# Patient Record
Sex: Female | Born: 1965 | Race: Black or African American | Hispanic: No | Marital: Single | Smoking: Former smoker
Health system: Southern US, Community
[De-identification: ages and names within clinical notes are randomized; demographics above are authoritative.]

## PROBLEM LIST (undated history)

## (undated) DIAGNOSIS — E079 Disorder of thyroid, unspecified: Secondary | ICD-10-CM

## (undated) DIAGNOSIS — I10 Essential (primary) hypertension: Secondary | ICD-10-CM

## (undated) DIAGNOSIS — K219 Gastro-esophageal reflux disease without esophagitis: Secondary | ICD-10-CM

## (undated) DIAGNOSIS — N83209 Unspecified ovarian cyst, unspecified side: Secondary | ICD-10-CM

## (undated) DIAGNOSIS — D75A Glucose-6-phosphate dehydrogenase (G6PD) deficiency without anemia: Secondary | ICD-10-CM

## (undated) DIAGNOSIS — K589 Irritable bowel syndrome without diarrhea: Secondary | ICD-10-CM

## (undated) DIAGNOSIS — J302 Other seasonal allergic rhinitis: Secondary | ICD-10-CM

## (undated) DIAGNOSIS — G510 Bell's palsy: Secondary | ICD-10-CM

## (undated) HISTORY — DX: Gastro-esophageal reflux disease without esophagitis: K21.9

## (undated) HISTORY — PX: OVARIAN CYST REMOVAL: SHX89

## (undated) HISTORY — PX: TUBAL LIGATION: SHX77

## (undated) HISTORY — PX: TONSILLECTOMY: SUR1361

## (undated) HISTORY — PX: APPENDECTOMY: SHX54

## (undated) HISTORY — PX: FOOT SURGERY: SHX648

---

## 1997-05-20 ENCOUNTER — Emergency Department (HOSPITAL_COMMUNITY): Admission: EM | Admit: 1997-05-20 | Discharge: 1997-05-20 | Payer: Self-pay | Admitting: Emergency Medicine

## 1997-08-15 ENCOUNTER — Emergency Department (HOSPITAL_COMMUNITY): Admission: EM | Admit: 1997-08-15 | Discharge: 1997-08-15 | Payer: Self-pay

## 1997-08-18 ENCOUNTER — Emergency Department (HOSPITAL_COMMUNITY): Admission: EM | Admit: 1997-08-18 | Discharge: 1997-08-18 | Payer: Self-pay | Admitting: Internal Medicine

## 1998-02-27 ENCOUNTER — Inpatient Hospital Stay (HOSPITAL_COMMUNITY): Admission: AD | Admit: 1998-02-27 | Discharge: 1998-02-27 | Payer: Self-pay | Admitting: Obstetrics & Gynecology

## 1998-05-25 ENCOUNTER — Emergency Department (HOSPITAL_COMMUNITY): Admission: EM | Admit: 1998-05-25 | Discharge: 1998-05-25 | Payer: Self-pay | Admitting: Emergency Medicine

## 1998-05-25 ENCOUNTER — Encounter: Payer: Self-pay | Admitting: Emergency Medicine

## 1998-06-12 ENCOUNTER — Emergency Department (HOSPITAL_COMMUNITY): Admission: EM | Admit: 1998-06-12 | Discharge: 1998-06-12 | Payer: Self-pay | Admitting: Emergency Medicine

## 1998-06-12 ENCOUNTER — Emergency Department (HOSPITAL_COMMUNITY): Admission: EM | Admit: 1998-06-12 | Discharge: 1998-06-12 | Payer: Self-pay

## 1998-06-14 ENCOUNTER — Encounter: Payer: Self-pay | Admitting: Obstetrics & Gynecology

## 1998-06-14 ENCOUNTER — Inpatient Hospital Stay (HOSPITAL_COMMUNITY): Admission: AD | Admit: 1998-06-14 | Discharge: 1998-06-14 | Payer: Self-pay | Admitting: Obstetrics & Gynecology

## 1998-06-17 ENCOUNTER — Inpatient Hospital Stay (HOSPITAL_COMMUNITY): Admission: AD | Admit: 1998-06-17 | Discharge: 1998-06-17 | Payer: Self-pay | Admitting: Obstetrics

## 1998-06-25 ENCOUNTER — Observation Stay (HOSPITAL_COMMUNITY): Admission: RE | Admit: 1998-06-25 | Discharge: 1998-06-26 | Payer: Self-pay | Admitting: Gynecology

## 1998-07-16 ENCOUNTER — Emergency Department (HOSPITAL_COMMUNITY): Admission: EM | Admit: 1998-07-16 | Discharge: 1998-07-16 | Payer: Self-pay | Admitting: Emergency Medicine

## 1998-07-27 ENCOUNTER — Emergency Department (HOSPITAL_COMMUNITY): Admission: EM | Admit: 1998-07-27 | Discharge: 1998-07-27 | Payer: Self-pay | Admitting: Emergency Medicine

## 1998-12-08 ENCOUNTER — Emergency Department (HOSPITAL_COMMUNITY): Admission: EM | Admit: 1998-12-08 | Discharge: 1998-12-08 | Payer: Self-pay | Admitting: Emergency Medicine

## 1998-12-08 ENCOUNTER — Encounter: Payer: Self-pay | Admitting: Emergency Medicine

## 1999-01-22 ENCOUNTER — Emergency Department (HOSPITAL_COMMUNITY): Admission: EM | Admit: 1999-01-22 | Discharge: 1999-01-22 | Payer: Self-pay | Admitting: *Deleted

## 1999-02-01 ENCOUNTER — Emergency Department (HOSPITAL_COMMUNITY): Admission: EM | Admit: 1999-02-01 | Discharge: 1999-02-01 | Payer: Self-pay | Admitting: Emergency Medicine

## 1999-02-06 ENCOUNTER — Emergency Department (HOSPITAL_COMMUNITY): Admission: EM | Admit: 1999-02-06 | Discharge: 1999-02-06 | Payer: Self-pay | Admitting: Emergency Medicine

## 1999-03-10 ENCOUNTER — Emergency Department (HOSPITAL_COMMUNITY): Admission: EM | Admit: 1999-03-10 | Discharge: 1999-03-10 | Payer: Self-pay | Admitting: Emergency Medicine

## 1999-03-10 ENCOUNTER — Encounter: Payer: Self-pay | Admitting: Emergency Medicine

## 1999-03-12 ENCOUNTER — Encounter: Payer: Self-pay | Admitting: Emergency Medicine

## 1999-03-12 ENCOUNTER — Ambulatory Visit (HOSPITAL_COMMUNITY): Admission: RE | Admit: 1999-03-12 | Discharge: 1999-03-12 | Payer: Self-pay | Admitting: Emergency Medicine

## 1999-04-14 ENCOUNTER — Emergency Department (HOSPITAL_COMMUNITY): Admission: EM | Admit: 1999-04-14 | Discharge: 1999-04-14 | Payer: Self-pay | Admitting: Emergency Medicine

## 1999-07-07 ENCOUNTER — Emergency Department (HOSPITAL_COMMUNITY): Admission: EM | Admit: 1999-07-07 | Discharge: 1999-07-07 | Payer: Self-pay | Admitting: Emergency Medicine

## 1999-07-20 ENCOUNTER — Emergency Department (HOSPITAL_COMMUNITY): Admission: EM | Admit: 1999-07-20 | Discharge: 1999-07-20 | Payer: Self-pay | Admitting: Internal Medicine

## 1999-10-05 ENCOUNTER — Emergency Department (HOSPITAL_COMMUNITY): Admission: EM | Admit: 1999-10-05 | Discharge: 1999-10-05 | Payer: Self-pay | Admitting: Emergency Medicine

## 1999-10-05 ENCOUNTER — Encounter: Payer: Self-pay | Admitting: Emergency Medicine

## 1999-10-13 ENCOUNTER — Emergency Department (HOSPITAL_COMMUNITY): Admission: EM | Admit: 1999-10-13 | Discharge: 1999-10-13 | Payer: Self-pay | Admitting: Emergency Medicine

## 2000-02-01 ENCOUNTER — Inpatient Hospital Stay (HOSPITAL_COMMUNITY): Admission: AD | Admit: 2000-02-01 | Discharge: 2000-02-01 | Payer: Self-pay | Admitting: Obstetrics & Gynecology

## 2000-02-10 ENCOUNTER — Emergency Department (HOSPITAL_COMMUNITY): Admission: EM | Admit: 2000-02-10 | Discharge: 2000-02-10 | Payer: Self-pay | Admitting: Emergency Medicine

## 2000-02-10 ENCOUNTER — Encounter: Payer: Self-pay | Admitting: Emergency Medicine

## 2000-03-20 ENCOUNTER — Emergency Department (HOSPITAL_COMMUNITY): Admission: EM | Admit: 2000-03-20 | Discharge: 2000-03-20 | Payer: Self-pay | Admitting: Emergency Medicine

## 2000-11-08 ENCOUNTER — Emergency Department (HOSPITAL_COMMUNITY): Admission: EM | Admit: 2000-11-08 | Discharge: 2000-11-08 | Payer: Self-pay | Admitting: Emergency Medicine

## 2001-12-21 ENCOUNTER — Emergency Department (HOSPITAL_COMMUNITY): Admission: EM | Admit: 2001-12-21 | Discharge: 2001-12-21 | Payer: Self-pay | Admitting: Emergency Medicine

## 2001-12-24 ENCOUNTER — Emergency Department (HOSPITAL_COMMUNITY): Admission: EM | Admit: 2001-12-24 | Discharge: 2001-12-24 | Payer: Self-pay | Admitting: Emergency Medicine

## 2002-02-19 ENCOUNTER — Emergency Department (HOSPITAL_COMMUNITY): Admission: EM | Admit: 2002-02-19 | Discharge: 2002-02-19 | Payer: Self-pay | Admitting: Emergency Medicine

## 2002-02-19 ENCOUNTER — Encounter: Payer: Self-pay | Admitting: Emergency Medicine

## 2002-10-28 ENCOUNTER — Emergency Department (HOSPITAL_COMMUNITY): Admission: EM | Admit: 2002-10-28 | Discharge: 2002-10-28 | Payer: Self-pay

## 2003-06-17 ENCOUNTER — Emergency Department (HOSPITAL_COMMUNITY): Admission: EM | Admit: 2003-06-17 | Discharge: 2003-06-17 | Payer: Self-pay | Admitting: Emergency Medicine

## 2004-01-27 ENCOUNTER — Emergency Department (HOSPITAL_COMMUNITY): Admission: EM | Admit: 2004-01-27 | Discharge: 2004-01-27 | Payer: Self-pay | Admitting: Emergency Medicine

## 2004-06-22 ENCOUNTER — Emergency Department (HOSPITAL_COMMUNITY): Admission: EM | Admit: 2004-06-22 | Discharge: 2004-06-22 | Payer: Self-pay | Admitting: Emergency Medicine

## 2005-04-17 ENCOUNTER — Emergency Department (HOSPITAL_COMMUNITY): Admission: EM | Admit: 2005-04-17 | Discharge: 2005-04-17 | Payer: Self-pay | Admitting: Emergency Medicine

## 2005-05-02 ENCOUNTER — Emergency Department (HOSPITAL_COMMUNITY): Admission: EM | Admit: 2005-05-02 | Discharge: 2005-05-02 | Payer: Self-pay | Admitting: Emergency Medicine

## 2005-05-10 ENCOUNTER — Emergency Department (HOSPITAL_COMMUNITY): Admission: EM | Admit: 2005-05-10 | Discharge: 2005-05-11 | Payer: Self-pay | Admitting: Emergency Medicine

## 2005-07-27 ENCOUNTER — Emergency Department (HOSPITAL_COMMUNITY): Admission: EM | Admit: 2005-07-27 | Discharge: 2005-07-27 | Payer: Self-pay | Admitting: Emergency Medicine

## 2006-09-13 ENCOUNTER — Inpatient Hospital Stay (HOSPITAL_COMMUNITY): Admission: AD | Admit: 2006-09-13 | Discharge: 2006-09-13 | Payer: Self-pay | Admitting: Gynecology

## 2008-02-14 ENCOUNTER — Emergency Department (HOSPITAL_COMMUNITY): Admission: EM | Admit: 2008-02-14 | Discharge: 2008-02-14 | Payer: Self-pay | Admitting: Emergency Medicine

## 2008-02-23 ENCOUNTER — Emergency Department (HOSPITAL_COMMUNITY): Admission: EM | Admit: 2008-02-23 | Discharge: 2008-02-23 | Payer: Self-pay | Admitting: Emergency Medicine

## 2009-10-06 ENCOUNTER — Emergency Department (HOSPITAL_COMMUNITY): Admission: EM | Admit: 2009-10-06 | Discharge: 2009-10-06 | Payer: Self-pay | Admitting: Emergency Medicine

## 2009-12-04 ENCOUNTER — Emergency Department (HOSPITAL_COMMUNITY): Admission: EM | Admit: 2009-12-04 | Discharge: 2009-12-04 | Payer: Self-pay | Admitting: Emergency Medicine

## 2010-04-28 LAB — URINALYSIS, ROUTINE W REFLEX MICROSCOPIC
Glucose, UA: NEGATIVE mg/dL
Ketones, ur: NEGATIVE mg/dL
Nitrite: NEGATIVE
Specific Gravity, Urine: 1.014 (ref 1.005–1.030)
pH: 6.5 (ref 5.0–8.0)

## 2010-04-28 LAB — POCT I-STAT, CHEM 8
BUN: 11 mg/dL (ref 6–23)
HCT: 39 % (ref 36.0–46.0)
Potassium: 4.4 mEq/L (ref 3.5–5.1)

## 2010-04-28 LAB — WET PREP, GENITAL: Yeast Wet Prep HPF POC: NONE SEEN

## 2010-04-28 LAB — GC/CHLAMYDIA PROBE AMP, GENITAL: Chlamydia, DNA Probe: NEGATIVE

## 2010-04-28 LAB — URINE MICROSCOPIC-ADD ON

## 2010-04-28 LAB — POCT PREGNANCY, URINE: Preg Test, Ur: NEGATIVE

## 2010-05-14 ENCOUNTER — Emergency Department (HOSPITAL_COMMUNITY)
Admission: EM | Admit: 2010-05-14 | Discharge: 2010-05-14 | Disposition: A | Discharge status: Home / Self Care | Payer: Self-pay | Attending: Emergency Medicine | Admitting: Emergency Medicine

## 2010-05-14 DIAGNOSIS — R059 Cough, unspecified: Secondary | ICD-10-CM | POA: Insufficient documentation

## 2010-05-14 DIAGNOSIS — R197 Diarrhea, unspecified: Secondary | ICD-10-CM | POA: Insufficient documentation

## 2010-05-14 DIAGNOSIS — R112 Nausea with vomiting, unspecified: Secondary | ICD-10-CM | POA: Insufficient documentation

## 2010-05-14 DIAGNOSIS — R51 Headache: Secondary | ICD-10-CM | POA: Insufficient documentation

## 2010-05-14 DIAGNOSIS — R05 Cough: Secondary | ICD-10-CM | POA: Insufficient documentation

## 2010-05-14 DIAGNOSIS — J069 Acute upper respiratory infection, unspecified: Secondary | ICD-10-CM | POA: Insufficient documentation

## 2010-11-29 LAB — URINALYSIS, ROUTINE W REFLEX MICROSCOPIC
Glucose, UA: NEGATIVE
Leukocytes, UA: NEGATIVE
Protein, ur: NEGATIVE
pH: 6

## 2010-11-29 LAB — URINE MICROSCOPIC-ADD ON

## 2010-11-29 LAB — CBC
HCT: 34.7 — ABNORMAL LOW
Platelets: 279

## 2010-11-29 LAB — GC/CHLAMYDIA PROBE AMP, GENITAL: GC Probe Amp, Genital: NEGATIVE

## 2010-11-29 LAB — WET PREP, GENITAL
Trich, Wet Prep: NONE SEEN
Yeast Wet Prep HPF POC: NONE SEEN

## 2010-11-29 LAB — POCT PREGNANCY, URINE: Preg Test, Ur: NEGATIVE

## 2011-04-17 ENCOUNTER — Inpatient Hospital Stay (HOSPITAL_COMMUNITY): Payer: Self-pay

## 2011-04-17 ENCOUNTER — Encounter (HOSPITAL_COMMUNITY): Payer: Self-pay | Admitting: *Deleted

## 2011-04-17 ENCOUNTER — Inpatient Hospital Stay (HOSPITAL_COMMUNITY)
Admission: AD | Admit: 2011-04-17 | Discharge: 2011-04-17 | Disposition: A | Discharge status: Home / Self Care | Payer: Self-pay | Source: Ambulatory Visit | Attending: Obstetrics & Gynecology | Admitting: Obstetrics & Gynecology

## 2011-04-17 DIAGNOSIS — N938 Other specified abnormal uterine and vaginal bleeding: Secondary | ICD-10-CM | POA: Insufficient documentation

## 2011-04-17 DIAGNOSIS — N939 Abnormal uterine and vaginal bleeding, unspecified: Secondary | ICD-10-CM

## 2011-04-17 DIAGNOSIS — N949 Unspecified condition associated with female genital organs and menstrual cycle: Secondary | ICD-10-CM | POA: Insufficient documentation

## 2011-04-17 DIAGNOSIS — N83209 Unspecified ovarian cyst, unspecified side: Secondary | ICD-10-CM | POA: Insufficient documentation

## 2011-04-17 DIAGNOSIS — N898 Other specified noninflammatory disorders of vagina: Secondary | ICD-10-CM

## 2011-04-17 HISTORY — DX: Irritable bowel syndrome, unspecified: K58.9

## 2011-04-17 HISTORY — DX: Glucose-6-phosphate dehydrogenase (G6PD) deficiency without anemia: D75.A

## 2011-04-17 HISTORY — DX: Unspecified ovarian cyst, unspecified side: N83.209

## 2011-04-17 HISTORY — DX: Bell's palsy: G51.0

## 2011-04-17 LAB — URINALYSIS, ROUTINE W REFLEX MICROSCOPIC
Glucose, UA: NEGATIVE mg/dL
Glucose, UA: NEGATIVE mg/dL
Ketones, ur: NEGATIVE mg/dL
Nitrite: NEGATIVE
Protein, ur: NEGATIVE mg/dL
Protein, ur: NEGATIVE mg/dL
Specific Gravity, Urine: 1.01 (ref 1.005–1.030)
Specific Gravity, Urine: 1.01 (ref 1.005–1.030)
pH: 6.5 (ref 5.0–8.0)
pH: 6.5 (ref 5.0–8.0)

## 2011-04-17 LAB — URINE MICROSCOPIC-ADD ON

## 2011-04-17 LAB — CBC
Hemoglobin: 11.2 g/dL — ABNORMAL LOW (ref 12.0–15.0)
MCH: 25.1 pg — ABNORMAL LOW (ref 26.0–34.0)
MCHC: 31.6 g/dL (ref 30.0–36.0)

## 2011-04-17 LAB — WET PREP, GENITAL: Clue Cells Wet Prep HPF POC: NONE SEEN

## 2011-04-17 LAB — ABO/RH: ABO/RH(D): A POS

## 2011-04-17 MED ORDER — ONDANSETRON 8 MG PO TBDP
8.0000 mg | ORAL_TABLET | Freq: Once | ORAL | Status: AC
  Administered 2011-04-17: 8 mg via ORAL
  Filled 2011-04-17: qty 1

## 2011-04-17 NOTE — ED Provider Notes (Signed)
Medical Screening exam and patient care preformed by advanced practice provider.  Agree with the above management.  

## 2011-04-17 NOTE — ED Provider Notes (Signed)
History   Robin Franco is a 46 y.o. year old G30P2002 female who presents to [facility] reporting vaginal bleeding x7 days that has been intermittent and at times heavy. Patient's last menstrual period was 03/25/2011. She is sexually active, has regular cycles and has not used birth control for many years, last pregnant 22 years ago.      CSN: [id number]  Arrival date & time 04/17/11  4098   None     Chief Complaint  Patient presents with  . Vaginal Bleeding    (Consider location/radiation/quality/duration/timing/severity/associated sxs/prior treatment) HPI  Past Medical History  Diagnosis Date  . Ovarian cyst   . Bell's palsy   . IBS (irritable bowel syndrome)   . G-6-PD deficiency     Past Surgical History  Procedure Date  . Cesarean section   . Ovarian cyst removal   . Appendectomy   . Foot surgery   . Tonsillectomy     History reviewed. No pertinent family history.  History  Substance Use Topics  . Smoking status: Never Smoker   . Smokeless tobacco: Not on file  . Alcohol Use: No    OB History    Grav Para Term Preterm Abortions TAB SAB Ect Mult Living   2 2 2  0 0 0 0 0 0 2      Review of Systems  Constitutional: Negative for fever and chills.  Gastrointestinal: Positive for nausea. Negative for vomiting and abdominal pain.  Genitourinary: Positive for vaginal bleeding.    Allergies  Aspirin  Home Medications  No current outpatient prescriptions on file.  BP 111/74  Pulse 74  Temp(Src) 97.9 F (36.6 C) (Oral)  Resp 18  Wt 73.029 kg (161 lb)  LMP 03/25/2011  Physical Exam  Constitutional: She is oriented to person, place, and time. She appears well-developed and well-nourished.  HENT:  Head: Normocephalic.  Neck: Normal range of motion.  Cardiovascular: Normal rate.   Pulmonary/Chest: Effort normal.  Abdominal: Soft. She exhibits no mass. There is no tenderness. There is no rebound and no guarding.  Genitourinary: Uterus is enlarged and tender  (mildly). Cervix exhibits no motion tenderness. Right adnexum displays no mass, no tenderness and no fullness. Left adnexum displays no mass, no tenderness and no fullness. There is bleeding (moderate amount of bright red blood with 1 small clot noted) around the vagina.  Neurological: She is alert and oriented to person, place, and time.  Skin: Skin is warm and dry.    ED Course   ADDENDUM TO ULTRASOUND REPORT PER DR> BROWN:  Uterus appears heterogeneous, but no discrete fibroids are seen. Endometrium is 5.14mm.  Left ovary is normal.  Right ovary has hemorrhagic cyst likely CLC 3cm.  Procedures (including critical care time)   GC/CHl culture to lab  Labs Reviewed  URINALYSIS, ROUTINE W REFLEX MICROSCOPIC - Abnormal; Notable for the following:    Hgb urine dipstick LARGE (*)    Leukocytes, UA TRACE (*)    All other components within normal limits  POCT PREGNANCY, URINE - Abnormal; Notable for the following:    Preg Test, Ur POSITIVE (*)    All other components within normal limits  CBC - Abnormal; Notable for the following:    Hemoglobin 11.2 (*)    HCT 35.4 (*)    MCH 25.1 (*)    All other components within normal limits  URINE MICROSCOPIC-ADD ON - Abnormal; Notable for the following:    Squamous Epithelial / LPF FEW (*)    All other  components within normal limits  POCT PREGNANCY, URINE - Abnormal; Notable for the following:    Preg Test, Ur POSITIVE (*)    All other components within normal limits  ABO/RH  HCG, QUANTITATIVE, PREGNANCY   No results found.   No diagnosis found.    MDM  Care of pt assumed by Georges Mouse at 1200.  Reassigned at 1300 to Jeani Sow, Charity fundraiser, FNP.  With concern of first UPT +, BHCG <1, second urine was collected- UPT on that specimen was NEGATIVE.  Will send second Urine to lab for UA.  I asked ultrasound if they could look at the uterus and given information as opposed to ordering a pelvic ultrasound in addition to the OB one.  Per [facility], Dr. Manson Passey  will add information to the Ob Ultrasound report. GCCHL culture to lab  Zofran was given in MAU for nausea   ASSESSMENT AND PLAN: A:  Abnormal Vaginal bleeding     Left hemorrhagic cyst  P:  Discusssed with the patient the results of her testing.      Number given to the clinic to call if this bleeding recurs after her next cycle.  Explained peri-menopausal bleeding.      Explained we would call her if her cultures are positive.       Katrinka Blazing, [place] 04/17/2011 1:08 PM        Matt Holmes, NP 04/17/11 1531

## 2011-04-17 NOTE — Progress Notes (Signed)
Pt reports having vaginal bleeding since Monday. LMP 03/25/11. Pt has history of ovairian cyst back in 2000. Staqted syotoms are the same as back then . Having abd  Bad cramping

## 2011-04-18 LAB — GC/CHLAMYDIA PROBE AMP, GENITAL
Chlamydia, DNA Probe: NEGATIVE
GC Probe Amp, Genital: NEGATIVE

## 2011-11-23 ENCOUNTER — Emergency Department (HOSPITAL_COMMUNITY)
Admission: EM | Admit: 2011-11-23 | Discharge: 2011-11-23 | Disposition: A | Discharge status: Home / Self Care | Payer: Self-pay | Attending: Emergency Medicine | Admitting: Emergency Medicine

## 2011-11-23 ENCOUNTER — Encounter (HOSPITAL_COMMUNITY): Payer: Self-pay | Admitting: Emergency Medicine

## 2011-11-23 DIAGNOSIS — D551 Anemia due to other disorders of glutathione metabolism: Secondary | ICD-10-CM | POA: Insufficient documentation

## 2011-11-23 DIAGNOSIS — R112 Nausea with vomiting, unspecified: Secondary | ICD-10-CM

## 2011-11-23 DIAGNOSIS — K589 Irritable bowel syndrome without diarrhea: Secondary | ICD-10-CM | POA: Insufficient documentation

## 2011-11-23 DIAGNOSIS — A088 Other specified intestinal infections: Secondary | ICD-10-CM | POA: Insufficient documentation

## 2011-11-23 DIAGNOSIS — A084 Viral intestinal infection, unspecified: Secondary | ICD-10-CM

## 2011-11-23 LAB — URINALYSIS, ROUTINE W REFLEX MICROSCOPIC
Glucose, UA: NEGATIVE mg/dL
Ketones, ur: NEGATIVE mg/dL
Nitrite: NEGATIVE
Specific Gravity, Urine: 1.009 (ref 1.005–1.030)
pH: 6.5 (ref 5.0–8.0)

## 2011-11-23 LAB — COMPREHENSIVE METABOLIC PANEL
AST: 17 U/L (ref 0–37)
Albumin: 4.1 g/dL (ref 3.5–5.2)
Alkaline Phosphatase: 99 U/L (ref 39–117)
Chloride: 104 mEq/L (ref 96–112)
Creatinine, Ser: 0.72 mg/dL (ref 0.50–1.10)
Potassium: 4.3 mEq/L (ref 3.5–5.1)
Total Bilirubin: 0.6 mg/dL (ref 0.3–1.2)
Total Protein: 7.7 g/dL (ref 6.0–8.3)

## 2011-11-23 LAB — CBC WITH DIFFERENTIAL/PLATELET
Basophils Absolute: 0.1 10*3/uL (ref 0.0–0.1)
Eosinophils Absolute: 0.2 10*3/uL (ref 0.0–0.7)
Lymphocytes Relative: 37 % (ref 12–46)
Monocytes Relative: 8 % (ref 3–12)
Neutrophils Relative %: 51 % (ref 43–77)
Platelets: 333 10*3/uL (ref 150–400)
RDW: 14.3 % (ref 11.5–15.5)
WBC: 6.3 10*3/uL (ref 4.0–10.5)

## 2011-11-23 LAB — URINE MICROSCOPIC-ADD ON

## 2011-11-23 NOTE — ED Provider Notes (Signed)
History     CSN: [id number]  Arrival date & time 11/23/11  1316   First MD Initiated Contact with Patient 11/23/11 1804      Chief Complaint  Patient presents with  . Abdominal Pain    (Consider location/radiation/quality/duration/timing/severity/associated sxs/prior treatment) HPI Comments: 46 year-old female presents to the emergency department with abdominal pain, nausea and vomiting after eating pasta and chicken from work on Saturday. States the pain gradually came on after eating. Describes the pain as constant, non radiating and cramping. Pain on Saturday was 10/10, decreasing over the past few days to 5/10. Vomited a lot Saturday, Sunday and Monday, but only had 1 episode of vomiting yesterday. No vomiting today. Ate crackers today without any problem. Admits to still being nauseated. Had fever of 99 yesterday which has since subsided. Other people at her work who ate the same thing also developed similar symptoms. 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 Even though she is beginning to feel better she wanted to come in to be checked out to make sure nothing serious was wrong. Denies bloody stool, lightheadedness, dizziness, weakness, fatigue.  Patient is a 46 y.o. female presenting with abdominal pain. The history is provided by the patient.  Abdominal Pain The primary symptoms of the illness include abdominal pain, fever, nausea, vomiting and diarrhea. The primary symptoms of the illness do not include fatigue or shortness of breath.  Symptoms associated with the illness do not include back pain.    Past Medical History  Diagnosis Date  . Ovarian cyst   . Bell's palsy   . IBS (irritable bowel syndrome)   . G-6-PD deficiency     Past Surgical History  Procedure Date  . Cesarean section   . Ovarian cyst removal   . Appendectomy   . Foot surgery   . Tonsillectomy     History reviewed. No pertinent family history.  History    Substance Use Topics  . Smoking status: Never Smoker   . Smokeless tobacco: Not on file  . Alcohol Use: No    OB History    Grav Para Term Preterm Abortions TAB SAB Ect Mult Living   2 2 2  0 0 0 0 0 0 2      Review of Systems  Constitutional: Positive for fever. Negative for fatigue.  HENT: Negative for neck pain and neck stiffness.   Respiratory: Negative for shortness of breath.   Cardiovascular: Negative for chest pain.  Gastrointestinal: Positive for nausea, vomiting, abdominal pain and diarrhea.  Genitourinary: Negative for difficulty urinating and pelvic pain.  Musculoskeletal: Negative for back pain.  Skin: Negative for color change, pallor and rash.  Neurological: Negative for dizziness, weakness and light-headedness.  Psychiatric/Behavioral: Negative for confusion.    Allergies  Aspirin and Caffeine  Home Medications   Current Outpatient Rx  Name Route Sig Dispense Refill  . ACETAMINOPHEN 500 MG PO TABS Oral Take 500 mg by mouth every 6 (six) hours as needed. For pain.    Marland Kitchen BISMUTH SUBSALICYLATE 262 MG/15ML PO SUSP Oral Take 15 mLs by mouth every 6 (six) hours as needed. Stomach upset      BP 141/76  Pulse 77  Temp 98.3 F (36.8 C) (Oral)  Resp 18  SpO2 100%  LMP 11/23/2011  Physical Exam  Nursing note and vitals reviewed. Constitutional: She is oriented to person, place, and time. Vital signs are normal. She appears well-developed and well-nourished. No distress.  HENT:  Head: Normocephalic and atraumatic.  Mouth/Throat: Oropharynx is clear and moist.  Eyes: Conjunctivae normal and EOM are normal.  Neck: Normal range of motion. Neck supple.  Cardiovascular: Normal rate, regular rhythm and normal heart sounds.   Pulmonary/Chest: Effort normal and breath sounds normal. No respiratory distress.  Abdominal: Soft. Normal appearance and bowel sounds are normal. She exhibits no mass. There is generalized tenderness (mild). There is no rigidity, no rebound  and no guarding.  Musculoskeletal: Normal range of motion.  Neurological: She is alert and oriented to person, place, and time.  Skin: Skin is warm and dry. No rash noted. No erythema. No pallor.  Psychiatric: She has a normal mood and affect. Her behavior is normal.    ED Course  Procedures (including critical care time)  Labs Reviewed  CBC WITH DIFFERENTIAL - Abnormal; Notable for the following:    MCV 77.2 (*)     MCH 25.1 (*)     All other components within normal limits  URINALYSIS, ROUTINE W REFLEX MICROSCOPIC - Abnormal; Notable for the following:    Hgb urine dipstick LARGE (*)     Leukocytes, UA TRACE (*)     All other components within normal limits  COMPREHENSIVE METABOLIC PANEL  LIPASE, BLOOD  PREGNANCY, URINE  URINE MICROSCOPIC-ADD ON   No results found.   1. Viral gastroenteritis   2. Nausea & vomiting       MDM  46 y/o female with abdominal pain, nausea and vomiting after eating pasta with chicken at work. Labs unremarkable. Currently not nauseous or vomiting. States she feels a lot better today than previous days. She is afebrile with normal vital signs. She was able to eat crackers and drink gingerale int he ED without becoming nauseated. She feels well to go home. Stable for discharge. Return precautions discussed.        Trevor Mace, PA-C 11/23/11 1903

## 2011-11-23 NOTE — ED Provider Notes (Signed)
Medical screening examination/treatment/procedure(s) were performed by non-physician practitioner and as supervising physician I was immediately available for consultation/collaboration.  Jassmin Kemmerer, MD 11/23/11 2359 

## 2011-11-23 NOTE — ED Notes (Signed)
On Saturday, pt ate some pasta and chicken from work and began vomiting and had severe abd pain approx 3 hours later.  Pt reports constant abd cramping and diarrhea, last emesis last night.  Pt reports several coworkers have had same sx.

## 2011-11-23 NOTE — ED Notes (Signed)
Tolerated PO's-no N/V

## 2012-11-05 ENCOUNTER — Emergency Department (HOSPITAL_COMMUNITY)
Admission: EM | Admit: 2012-11-05 | Discharge: 2012-11-05 | Disposition: A | Discharge status: Home / Self Care | Payer: Worker's Compensation | Attending: Emergency Medicine | Admitting: Emergency Medicine

## 2012-11-05 ENCOUNTER — Encounter (HOSPITAL_COMMUNITY): Payer: Self-pay | Admitting: Emergency Medicine

## 2012-11-05 DIAGNOSIS — Z8719 Personal history of other diseases of the digestive system: Secondary | ICD-10-CM | POA: Insufficient documentation

## 2012-11-05 DIAGNOSIS — Z8742 Personal history of other diseases of the female genital tract: Secondary | ICD-10-CM | POA: Insufficient documentation

## 2012-11-05 DIAGNOSIS — Y9389 Activity, other specified: Secondary | ICD-10-CM | POA: Insufficient documentation

## 2012-11-05 DIAGNOSIS — Z862 Personal history of diseases of the blood and blood-forming organs and certain disorders involving the immune mechanism: Secondary | ICD-10-CM | POA: Insufficient documentation

## 2012-11-05 DIAGNOSIS — X12XXXA Contact with other hot fluids, initial encounter: Secondary | ICD-10-CM | POA: Insufficient documentation

## 2012-11-05 DIAGNOSIS — Y9269 Other specified industrial and construction area as the place of occurrence of the external cause: Secondary | ICD-10-CM | POA: Insufficient documentation

## 2012-11-05 DIAGNOSIS — T300 Burn of unspecified body region, unspecified degree: Secondary | ICD-10-CM

## 2012-11-05 DIAGNOSIS — T23179A Burn of first degree of unspecified wrist, initial encounter: Secondary | ICD-10-CM | POA: Insufficient documentation

## 2012-11-05 DIAGNOSIS — Y990 Civilian activity done for income or pay: Secondary | ICD-10-CM | POA: Insufficient documentation

## 2012-11-05 DIAGNOSIS — Z8669 Personal history of other diseases of the nervous system and sense organs: Secondary | ICD-10-CM | POA: Insufficient documentation

## 2012-11-05 MED ORDER — SILVER SULFADIAZINE 1 % EX CREA: TOPICAL_CREAM | Freq: Every day | CUTANEOUS | Status: DC

## 2012-11-05 MED ORDER — SILVER SULFADIAZINE 1 % EX CREA
TOPICAL_CREAM | Freq: Once | CUTANEOUS | Status: AC
  Administered 2012-11-05: 18:00:00 via TOPICAL
  Filled 2012-11-05: qty 50
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
## 2012-11-06 NOTE — ED Provider Notes (Signed)
Medical screening examination/treatment/procedure(s) were performed by non-physician practitioner and as supervising physician I was immediately available for consultation/collaboration.  Courtney F Horton, MD 11/06/12 0009 

## 2013-01-07 ENCOUNTER — Encounter (HOSPITAL_COMMUNITY): Payer: Self-pay | Admitting: *Deleted

## 2013-01-07 ENCOUNTER — Inpatient Hospital Stay (HOSPITAL_COMMUNITY)
Admission: AD | Admit: 2013-01-07 | Discharge: 2013-01-07 | Disposition: A | Discharge status: Home / Self Care | Payer: PRIVATE HEALTH INSURANCE | Source: Ambulatory Visit | Attending: Obstetrics and Gynecology | Admitting: Obstetrics and Gynecology

## 2013-01-07 DIAGNOSIS — A5901 Trichomonal vulvovaginitis: Secondary | ICD-10-CM

## 2013-01-07 DIAGNOSIS — L293 Anogenital pruritus, unspecified: Secondary | ICD-10-CM | POA: Insufficient documentation

## 2013-01-07 LAB — URINE MICROSCOPIC-ADD ON

## 2013-01-07 LAB — URINALYSIS, ROUTINE W REFLEX MICROSCOPIC
Bilirubin Urine: NEGATIVE
Glucose, UA: NEGATIVE mg/dL
Ketones, ur: NEGATIVE mg/dL
Nitrite: NEGATIVE
Protein, ur: NEGATIVE mg/dL
Specific Gravity, Urine: >1.03 — ABNORMAL HIGH (ref 1.005–1.030)
Urobilinogen, UA: 1 mg/dL (ref 0.0–1.0)
pH: 6 (ref 5.0–8.0)

## 2013-01-07 LAB — POCT PREGNANCY, URINE: Preg Test, Ur: NEGATIVE

## 2013-01-07 LAB — WET PREP, GENITAL: Yeast Wet Prep HPF POC: NONE SEEN

## 2013-01-07 MED ORDER — METRONIDAZOLE 500 MG PO TABS: 500.0000 mg | ORAL_TABLET | Freq: Once | ORAL | Status: DC

## 2013-01-07 NOTE — MAU Note (Signed)
Patient states she has had a white vaginal discharge with an odor for about 4 days. Back pain for a few days.

## 2013-01-07 NOTE — MAU Provider Note (Signed)
CC: Vaginal Discharge    First Provider Initiated Contact with Patient 01/07/13 1622      HPI Robin Franco is a 47 y.o. [id number] who presents with onset 4 days ago of thick white vaginal discharge and vaginal itching. She inserted yogurt in her vagina 3 hrs before coming in. Symptoms are similar to yeast infections she has had in the past. New sex partner 1 month ago- used condoms. No malodor. Mild LBP. No dysuria, urgency, frequency. LMP 01/03/13 and still bleeding. Denies abnormal bleeding  Past Medical History  Diagnosis Date  . Ovarian cyst   . Bell's palsy   . IBS (irritable bowel syndrome)   . G-6-PD deficiency     OB History  Gravida Para Term Preterm AB SAB TAB Ectopic Multiple Living  2 2 2  0 0 0 0 0 0 2    # Outcome Date GA Lbr Len/2nd Weight Sex Delivery Anes PTL Lv  2 TRM           1 TRM               Past Surgical History  Procedure Laterality Date  . Cesarean section    . Ovarian cyst removal    . Appendectomy    . Foot surgery    . Tonsillectomy    . Tubal ligation      History   Social History  . Marital Status: Single    Spouse Name: N/A    Number of Children: N/A  . Years of Education: N/A   Occupational History  . Not on file.   Social History Main Topics  . Smoking status: Never Smoker   . Smokeless tobacco: Not on file  . Alcohol Use: No  . Drug Use: Yes    Special: Marijuana     Comment: one month ago  . Sexual Activity: Yes    Birth Control/ Protection: None     Comment: one month ago   Other Topics Concern  . Not on file   Social History Narrative  . No narrative on file    No current facility-administered medications on file prior to encounter.   No current outpatient prescriptions on file prior to encounter.    Allergies  Allergen Reactions  . Aspirin Nausea Only  . Caffeine Other (See Comments)    Nausea     ROS Pertinent items in HPI  PHYSICAL EXAM Filed Vitals:   01/07/13 1511  BP: 120/76  Pulse: 75   Temp: 98.7 F (37.1 C)  Resp: 16   General: Well nourished, well developed female in no acute distress Cardiovascular: Normal rate Respiratory: Normal effort Abdomen: Soft, nontender Back: No CVAT Extremities: No edema Neurologic: Alert and oriented Speculum exam: NEFG; vagina with moderate blood; no inflamation noted, cervix reddened Bimanual exam: cervix closed, no CMT; uterus NSSP; no adnexal tenderness or masses   LAB RESULTS Results for orders placed during the hospital encounter of 01/07/13 (from the past 24 hour(s))  URINALYSIS, ROUTINE W REFLEX MICROSCOPIC     Status: Abnormal   Collection Time    01/07/13  3:15 PM      Result Value Range   Color, Urine YELLOW  YELLOW   APPearance CLEAR  CLEAR   Specific Gravity, Urine >1.030 (*) 1.005 - 1.030   pH 6.0  5.0 - 8.0   Glucose, UA NEGATIVE  NEGATIVE mg/dL   Hgb urine dipstick LARGE (*) NEGATIVE   Bilirubin Urine NEGATIVE  NEGATIVE   Ketones, ur NEGATIVE  NEGATIVE mg/dL   Protein, ur NEGATIVE  NEGATIVE mg/dL   Urobilinogen, UA 1.0  0.0 - 1.0 mg/dL   Nitrite NEGATIVE  NEGATIVE   Leukocytes, UA TRACE (*) NEGATIVE  URINE MICROSCOPIC-ADD ON     Status: Abnormal   Collection Time    01/07/13  3:15 PM      Result Value Range   Squamous Epithelial / LPF FEW (*) RARE   WBC, UA 0-2  <3 WBC/hpf   RBC / HPF 0-2  <3 RBC/hpf   Urine-Other TRICHOMONAS PRESENT    POCT PREGNANCY, URINE     Status: None   Collection Time    01/07/13  3:19 PM      Result Value Range   Preg Test, Ur NEGATIVE  NEGATIVE  WET PREP, GENITAL     Status: Abnormal   Collection Time    01/07/13  4:29 PM      Result Value Range   Yeast Wet Prep HPF POC NONE SEEN  NONE SEEN   Trich, Wet Prep FEW (*) NONE SEEN   Clue Cells Wet Prep HPF POC NONE SEEN  NONE SEEN   WBC, Wet Prep HPF POC FEW (*) NONE SEEN    IMAGING No results found.  MAU COURSE   ASSESSMENT  1. Trichomonas vaginalis (TV) infection     PLAN Discharge home. See AVS for patient  education.    Medication List         metroNIDAZOLE 500 MG tablet  Commonly known as:  FLAGYL  Take 1 tablet (500 mg total) by mouth once.       Follow-up Information   Schedule an appointment as soon as possible for a visit with [facility].   Specialty:  Obstetrics and Gynecology   Contact information:   [address] [place] [address] [phone]       Danae Orleans, CNM 01/07/2013 4:28 PM

## 2013-01-08 LAB — GC/CHLAMYDIA PROBE AMP
CT Probe RNA: NEGATIVE
GC Probe RNA: NEGATIVE

## 2013-01-09 NOTE — MAU Provider Note (Signed)
Attestation of Attending Supervision of Advanced Practitioner (CNM/NP): Evaluation and management procedures were performed by the Advanced Practitioner under my supervision and collaboration.  I have reviewed the Advanced Practitioner's note and chart, and I agree with the management and plan.  Robin Franco 01/09/2013 10:54 AM

## 2013-04-24 ENCOUNTER — Encounter (HOSPITAL_COMMUNITY): Payer: Self-pay | Admitting: Emergency Medicine

## 2013-04-24 ENCOUNTER — Emergency Department (HOSPITAL_COMMUNITY): Payer: PRIVATE HEALTH INSURANCE

## 2013-04-24 ENCOUNTER — Emergency Department (HOSPITAL_COMMUNITY)
Admission: EM | Admit: 2013-04-24 | Discharge: 2013-04-24 | Disposition: A | Discharge status: Home / Self Care | Payer: PRIVATE HEALTH INSURANCE | Attending: Emergency Medicine | Admitting: Emergency Medicine

## 2013-04-24 DIAGNOSIS — Z8719 Personal history of other diseases of the digestive system: Secondary | ICD-10-CM | POA: Insufficient documentation

## 2013-04-24 DIAGNOSIS — B349 Viral infection, unspecified: Secondary | ICD-10-CM

## 2013-04-24 DIAGNOSIS — Z862 Personal history of diseases of the blood and blood-forming organs and certain disorders involving the immune mechanism: Secondary | ICD-10-CM | POA: Insufficient documentation

## 2013-04-24 DIAGNOSIS — J069 Acute upper respiratory infection, unspecified: Secondary | ICD-10-CM | POA: Insufficient documentation

## 2013-04-24 DIAGNOSIS — R079 Chest pain, unspecified: Secondary | ICD-10-CM | POA: Insufficient documentation

## 2013-04-24 DIAGNOSIS — Z8742 Personal history of other diseases of the female genital tract: Secondary | ICD-10-CM | POA: Insufficient documentation

## 2013-04-24 DIAGNOSIS — Z8669 Personal history of other diseases of the nervous system and sense organs: Secondary | ICD-10-CM | POA: Insufficient documentation

## 2013-04-24 DIAGNOSIS — B9789 Other viral agents as the cause of diseases classified elsewhere: Secondary | ICD-10-CM | POA: Insufficient documentation

## 2013-04-24 DIAGNOSIS — R0602 Shortness of breath: Secondary | ICD-10-CM | POA: Insufficient documentation

## 2013-04-24 MED ORDER — ALBUTEROL SULFATE HFA 108 (90 BASE) MCG/ACT IN AERS
2.0000 | INHALATION_SPRAY | Freq: Once | RESPIRATORY_TRACT | Status: AC
  Administered 2013-04-24: 2 via RESPIRATORY_TRACT
  Filled 2013-04-24: qty 6.7

## 2013-04-24 MED ORDER — AZITHROMYCIN 250 MG PO TABS
250.0000 mg | ORAL_TABLET | Freq: Every day | ORAL | Status: DC
Start: 2013-04-24 — End: 2013-09-14

## 2013-04-24 NOTE — ED Notes (Signed)
Pt taken to xray 

## 2013-04-24 NOTE — ED Provider Notes (Signed)
CSN: [id number]     Arrival date & time 04/24/13  1059 History  This chart was scribed for non-physician practitioner, Raymon Mutton, PA-C,working with Flint Melter, MD, by Karle Plumber, ED Scribe.  This patient was seen in room TR10C/TR10C and the patient's care was started at 1:57 PM.  Chief Complaint  Patient presents with  . Nasal Congestion   The history is provided by the patient. No language interpreter was used.   HPI Comments:  Robin Franco is a 48 y.o. female who presents to the Emergency Department complaining of chest and nasal congestion onset four days ago. Pt reports associated chills, mild SOB, HA, productive cough of clear phlegm. She reports sick contacts at work. She reports fever of 100 degrees last night. She reports taking DayQuil and NyQuil with no relief. She denies numbness, tingling, bowel or bladder issues, CP, neck pain or stiffness, blurred vision, nausea, vomiting, or diarrhea.   Past Medical History  Diagnosis Date  . Ovarian cyst   . Bell's palsy   . IBS (irritable bowel syndrome)   . G-6-PD deficiency    Past Surgical History  Procedure Laterality Date  . Cesarean section    . Ovarian cyst removal    . Appendectomy    . Foot surgery    . Tonsillectomy    . Tubal ligation     No family history on file. History  Substance Use Topics  . Smoking status: Never Smoker   . Smokeless tobacco: Not on file  . Alcohol Use: No   OB History   Grav Para Term Preterm Abortions TAB SAB Ect Mult Living   2 2 2  0 0 0 0 0 0 2     Review of Systems  HENT: Positive for congestion.   Eyes: Negative for visual disturbance.  Respiratory: Positive for shortness of breath (secondary to congestion).   Cardiovascular: Negative for chest pain.  Gastrointestinal: Negative for nausea, vomiting and diarrhea.  Musculoskeletal:       Generalized body aches  All other systems reviewed and are negative.    Allergies  Aspirin and Caffeine  Home Medications    Current Outpatient Rx  Name  Route  Sig  Dispense  Refill  . Pseudoeph-Doxylamine-DM-APAP (NYQUIL PO)   Oral   Take 2 capsules by mouth at bedtime as needed (for cold).         Marland Kitchen azithromycin (ZITHROMAX) 250 MG tablet   Oral   Take 1 tablet (250 mg total) by mouth daily. Take first 2 tablets together, then 1 every day until finished.   6 tablet   0    Triage Vitals: BP 120/64  Pulse 86  Temp(Src) 98.6 F (37 C) (Oral)  Resp 18  SpO2 99%  LMP 04/15/2013 Physical Exam  Nursing note and vitals reviewed. Constitutional: She is oriented to person, place, and time. She appears well-developed and well-nourished.  HENT:  Head: Normocephalic and atraumatic.  Mouth/Throat: Oropharynx is clear and moist. No oropharyngeal exudate.  Eyes: Conjunctivae and EOM are normal. Pupils are equal, round, and reactive to light. Right eye exhibits no discharge. Left eye exhibits no discharge.  Neck: Normal range of motion. Neck supple. No tracheal deviation present.  Negative neck stiffness Negative nuchal rigidity Negative cervical lymphadenopathy  Cardiovascular: Normal rate, regular rhythm and normal heart sounds.  Exam reveals no friction rub.   No murmur heard. Pulses:      Radial pulses are 2+ on the right side, and 2+ on the  left side.  Cap refill less than 3 seconds  Pulmonary/Chest: Effort normal and breath sounds normal. No respiratory distress. She has no wheezes. She has no rales. She exhibits no tenderness.  Patient is able to speak in full sentences without difficulty Negative stridor Negative use of excess her muscles  Musculoskeletal: Normal range of motion.  Full ROM to upper and lower extremities without difficulty noted, negative ataxia noted.  Lymphadenopathy:    She has no cervical adenopathy.  Neurological: She is alert and oriented to person, place, and time. No cranial nerve deficit. She exhibits normal muscle tone. Coordination normal.  Skin: Skin is warm and dry.   Psychiatric: She has a normal mood and affect. Her behavior is normal.    ED Course  Procedures (including critical care time) DIAGNOSTIC STUDIES: Oxygen Saturation is 99% on RA, normal by my interpretation.   COORDINATION OF CARE: 2:01 PM- Will order albuterol inhaler and Z-Pak. Pt verbalizes understanding and agrees to plan.  Medications - No data to display  Dg Chest 2 View  04/24/2013   CLINICAL DATA Cough, congestion  EXAM CHEST  2 VIEW  COMPARISON DG RIBS UNILATERAL W/CHEST*R* dated 10/06/2009  FINDINGS The heart size and mediastinal contours are within normal limits. Both lungs are clear. The visualized skeletal structures are unremarkable.  IMPRESSION No active cardiopulmonary disease.  SIGNATURE  Electronically Signed   By: Elige Ko   On: 04/24/2013 12:32   Labs Review Labs Reviewed - No data to display Imaging Review Dg Chest 2 View  04/24/2013   CLINICAL DATA Cough, congestion  EXAM CHEST  2 VIEW  COMPARISON DG RIBS UNILATERAL W/CHEST*R* dated 10/06/2009  FINDINGS The heart size and mediastinal contours are within normal limits. Both lungs are clear. The visualized skeletal structures are unremarkable.  IMPRESSION No active cardiopulmonary disease.  SIGNATURE  Electronically Signed   By: Elige Ko   On: 04/24/2013 12:32     EKG Interpretation None      MDM   Final diagnoses:  Viral syndrome  URI (upper respiratory infection)    Filed Vitals:   04/24/13 1107  BP: 120/64  Pulse: 86  Temp: 98.6 F (37 C)  TempSrc: Oral  Resp: 18  SpO2: 99%    I personally performed the services described in this documentation, which was scribed in my presence. The recorded information has been reviewed and is accurate.  Patient presenting to the ED regarding nasal congestion, productive cough, mild sore throat that started approximately 3 days ago. Stated that she's been using NyQuil and DayQuil over-the-counter with minimal relief. 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 Alert and oriented. GCS 15. Heart rate and rhythm normal. Lungs clear to auscultation to upper lower lobes bilaterally. Negative use of excess her muscles. Negative stridor. Patient able to speak in full senses without difficulty. Full range of motion to upper lower tremors bilaterally. Cap refill less than 3 seconds. Radial pulses 2+ bilaterally. Negative signs of respiratory distress. Plain film of chest negative for acute cardiac coronary disease. Doubt pneumonia. Doubt pneumothorax. Suspicion to be viral syndrome. Patient stable, afebrile. Negative signs of respiratory distress. Discharged patient. Discussed with patient to rest and stay hydrated. Discussed with patient to followup with health and wellness Center. Discharge patient with antibiotics and albuterol inhaler. Discussed with patient to closely monitor symptoms and if symptoms are to worsen or change to report back to the ED - strict return instructions given.  Patient agreed to plan of care, understood, all questions answered.   Raymon MuttonMarissa Milisa Kimbell, PA-C 04/24/13 1749

## 2013-04-24 NOTE — Discharge Instructions (Signed)
Please call your doctor for a followup appointment within 24-48 hours. When you talk to your doctor please let them know that you were seen in the emergency department and have them acquire all of your records so that they can discuss the findings with you and formulate a treatment plan to fully care for your new and ongoing problems. Please call and set up appointment with health and wellness Center to be reassessed Please take antibiotics as prescribed Please use albuterol inhaler as needed for shortness of breath Please rest and drink plenty of fluids Please avoid any physical or strenuous activity Please continue to monitor symptoms closely and if symptoms are to worsen or change (fever greater 101, chills, sweating, nausea, vomiting, diarrhea, neck pain, neck stiffness, inability to swallow, difficulty swallowing, chest pain, shortness of breath, difficulty breathing) please report back to the ED immediately  Viral Infections A virus is a type of germ. Viruses can cause:  Minor sore throats.  Aches and pains.  Headaches.  Runny nose.  Rashes.  Watery eyes.  Tiredness.  Coughs.  Loss of appetite.  Feeling sick to your stomach (nausea).  Throwing up (vomiting).  Watery poop (diarrhea). HOME CARE   Only take medicines as told by your doctor.  Drink enough water and fluids to keep your pee (urine) clear or pale yellow. Sports drinks are a good choice.  Get plenty of rest and eat healthy. Soups and broths with crackers or rice are fine. GET HELP RIGHT AWAY IF:   You have a very bad headache.  You have shortness of breath.  You have chest pain or neck pain.  You have an unusual rash.  You cannot stop throwing up.  You have watery poop that does not stop.  You cannot keep fluids down.  You or your child has a temperature by mouth above 102 F (38.9 C), not controlled by medicine.  Your baby is older than 3 months with a rectal temperature of 102 F (38.9 C)  or higher.  Your baby is 104 months old or younger with a rectal temperature of 100.4 F (38 C) or higher. MAKE SURE YOU:   Understand these instructions.  Will watch this condition.  Will get help right away if you are not doing well or get worse. Document Released: 01/14/2008 Document Revised: 04/25/2011 Document Reviewed: 06/08/2010 [facility] Patient Information 2014 [place], [place].   Emergency Department Resource Guide 1) Find a Doctor and Pay Out of Pocket Although you won't have to find out who is covered by your insurance plan, it is a good idea to ask around and get recommendations. You will then need to call the office and see if the doctor you have chosen will accept you as a new patient and what types of options they offer for patients who are self-pay. Some doctors offer discounts or will set up payment plans for their patients who do not have insurance, but you will need to ask so you aren't surprised when you get to your appointment.  2) Contact Your Local Health Department Not all health departments have doctors that can see patients for sick visits, but many do, so it is worth a call to see if yours does. If you don't know where your local health department is, you can check in your phone book. The CDC also has a tool to help you locate your state's health department, and many state websites also have listings of all of their local health departments.  3) Find a  Walk-in Clinic If your illness is not likely to be very severe or complicated, you may want to try a walk in clinic. These are popping up all over the country in pharmacies, drugstores, and shopping centers. They're usually staffed by nurse practitioners or physician assistants that have been trained to treat common illnesses and complaints. They're usually fairly quick and inexpensive. However, if you have serious medical issues or chronic medical problems, these are probably not your best option.  No Primary Care  Doctor: - Call Health Connect at  [phone] - they can help you locate a primary care doctor that  accepts your insurance, provides certain services, etc. - Physician Referral Service- [phone]  Chronic Pain Problems: Organization         Address  Phone   Notes  [facility]  [phone] Patients need to be referred by their primary care doctor.   Medication Assistance: Organization         Address  Phone   Notes  [facility] Medication [facility] [address] [place] [place], [place] [address] [phone] --Must be a resident of [facility] -- Must have NO insurance coverage whatsoever (no Medicaid/ Medicare, etc.) -- The pt. MUST have a primary care doctor that directs their care regularly and follows them in the community   [facility]ist  [phone]   [employer]  [phone]    Agencies that provide inexpensive medical care: Organization         Address  Phone   Notes  [facility]  [phone]   [facility]    [phone]   [facility] [address] [place], [place] [address] [phone]   [facility] [address] [place], [place] [phone]   Planned Parenthood    [phone]   [facility]    [phone]   Community Health and [facility]  [address], [place] Phone:  [phone], Fax:  [phone] Hours of Operation:  9 am - 6 pm, M-F.  Also accepts Medicaid/Medicare and self-pay.  [facility]  [address], [place] Phone: [phone], Fax: [phone]. Hours of Operation:  8:30 am - 5:30 pm, M-F.  Also accepts Medicaid and self-pay.  [facility] [place] [address], [place] Phone: [phone]   [facility] [address] Natasha Bence [place], [place] [phone], Ext. [phone] Mondays & Thursdays: 7-9 AM.  First 15 patients are seen on a first  come, first serve basis.    Medicaid-accepting [facility] Providers:  Organization         Address  Phone   Notes  [facility] [place] [address], [place] [phone] Also accepts self-pay patients.  [facility] [address] Laurell Josephs [place], [place]  [phone]   [facility] [address], [place] [phone]   [facility] [address], [place] [phone]   Renaye Rakers [address], [place]   [phone] Only accepts [place] Access [place] patients after they have their name applied to their card.   Self-Pay (no insurance) in [facility]:  Organization         Address  Phone   Notes  Sickle Cell Patients, [facility] [address] [place],  [place] [phone]   [facility] [address], [place] [phone]) [phone]   [facility] [place]  [address], [place] [phone]   [facility]/Dr. Osei-Bonsu  [address] [place] [address], [place] or [phone], [place] [phone]) [phone] Phone number for both [place] and [place] locations is the same.  [facility] [address], [place] [phone]) [phone]   [facility] [place] [address] [place] [address], [place] or [address] [phone]   [facility] [address], [place] [phone]) [phone], phone; [phone]) [phone], fax Sees patients 1st and 3rd Saturday of every month.  Must not qualify for public or private insurance (i.e. Medicaid, Medicare, [place] Health Choice, Veterans' Benefits)  Household income should be no more than 200% of the poverty level The clinic cannot treat you if you are pregnant or think you are pregnant  Sexually transmitted diseases are not treated at the clinic.    Dental Care: Organization          Address  Phone  Notes  [facility] [address], [place] [phone]) [phone] Accepts children up to age 47 who are enrolled in [place] or [place] Health Choice; pregnant women with a Medicaid card; and children who have applied for Medicaid or [place] Health Choice, but were declined, whose parents can pay a reduced fee at time of service.  [facility] [place]  [address], [place] [phone]) [phone] Accepts children up to age 47 who are enrolled in [place] or  Health Choice; pregnant women with a Medicaid card; and children who have applied for Medicaid or  Health Choice, but were declined, whose parents can pay a reduced fee at time of service.  [facility]  [address], [place] [phone]) [phone] Patients are seen by appointment only. Walk-ins are not accepted. [facility] will see patients 47 years of age and older. Monday - Tuesday (8am-5pm) Most Wednesdays (8:30-5pm) $30 per visit, cash only  [facility]  [address], [place] [facility] [place] [phone]) [phone] Patients are seen by appointment only. Walk-ins are not accepted. [facility] will see patients 47 years of age and older. One Wednesday Evening (Monthly: Volunteer Based).  $30 per visit, cash only  [facility]  [phone] for adults; Children under age 604, call Graduate Pediatric Dentistry at [phone]. Children aged 714-14, please call [phone] to request a pediatric application.  Dental services are provided in all areas of dental care including fillings, crowns and bridges, complete and partial dentures, implants, gum treatment, root canals, and extractions. Preventive care is also provided. Treatment is provided to both adults and children. Patients are selected via a lottery and there is often a waiting list.   [facility] [address], [place]  [phone]) [phone] [email or web address]   [facility] [address], [place], [place] [phone], Ext. [phone] Second and Fourth Thursday of each month, opens at 6:30 AM; Clinic ends at 9 AM.  Patients are seen on a first-come first-served basis, and a limited number are seen during each clinic.   [facility]  [address], [place], [place] [phone]) [phone]   Eligibility Requirements You must have lived in [place], [place], or [place]  counties for at least the last three months.   You cannot be eligible for state or federal sponsored [place], including [employer], [place], or [employer].   You generally cannot be eligible for healthcare insurance through your employer.    How to apply: Eligibility screenings are held every Tuesday and Wednesday afternoon from 1:00 pm until 4:00 pm. You do not need an appointment for the interview!  [facility] [address] [place], [place] [phone]   [facility] Department  [phone]   [facility] Health Department  [phone]   [facility] Health Department  [phone]    Behavioral Health Resources in the Community: Intensive Outpatient Programs Organization         Address  Phone  Notes  [facility] [address], [place], [place] [phone]   [facility] Outpatient [address], [place], [place] [phone]   [facility] [address] [place], [place]  [phone]   [facility] Mental Health [address]  [place], [place] [phone] or [phone]   Substance Abuse Resources Organization         Address  Phone  Notes  Alcohol and Drug Services  [phone]   [facility]  [phone]   [facility]  7877256989   Floydene Flock  [phone]   Residential & Outpatient Substance Abuse Program  [phone]   Psychological  Services Organization         Address  Phone  Notes  [facility]  [phone]   [facility] Services  [phone]   [facility] [address] [place] [phone] or [phone]    Mobile Crisis Teams Organization         Address  Phone  Notes  [facility], Mobile Crisis Care Unit  6103895014   [facility]  [address] [place], [place] [phone]   Doristine Locks [address] [place] [phone]    Self-Help/Support Groups Organization         Address  Phone             Notes  [facility] [place] - variety of support groups  336- I7437963 Call for more information  Narcotics Anonymous (NA), [facility] [address], [employer] [place]  2 meetings at this location   Statistician         Address  Phone  Notes  [facility] [address],    [place]  [phone]   [facility]  [address] [place] [address], [place], [place] [phone]   [facility] [address] [place], [place] [phone] Admissions: 8am-3pm M-F  [facility] [address]    [place], [place] [phone]   [facility] [address] [place], [place], [place] [phone]   [facility] [address]  [place], [place] [phone]   [facility] - Intensive Outpatient [address]urell Josephs [address], [place], [place] [phone]   [facility] [address]  [place], [place] [phone] or [phone]   [facility] ([facility]) [address], [place], [place] [phone] Accepts Medicaid  [facility] [address],  [place] [phone] Substance Abuse/Addiction Treatment   [facility] Resources Organization         Address  Phone  Notes  Estate manager/land agent Services  (  [phone]   Angie Fava, PhD [address] Ervin Knack [place], [place]   [phone] or [phone]   [facility] Behavioral   [address] [place], [place] [phone]   [facility] [address], [place], [place] [phone] Insurance/Medicaid/sponsorship through [facility] and Families [address]                                    [place], [place] [phone] Therapy/tele-psych/case  [facility] [address], [place] [phone]    Dr. Lolly Mustache  [phone]   [facility]  [employer] [place] [facility] Dept. 1) [address], [place] 2) [address], [place] 3)  [address], [place] [phone]  [phone]   [facility] Child Abuse Hotline 7635663854 or [phone] (After Hours)

## 2013-04-24 NOTE — ED Notes (Signed)
Pt upset regarding wait times and sts visitors are waiting, attempted to explain delay to patient and pt walked away closing door.

## 2013-04-24 NOTE — ED Notes (Signed)
Pt states she has had cold symptoms since Saturday. Has had head and nasal congestion with clear sputum and mild body aches.

## 2013-04-26 NOTE — ED Provider Notes (Signed)
Medical screening examination/treatment/procedure(s) were performed by non-physician practitioner and as supervising physician I was immediately available for consultation/collaboration.   EKG Interpretation None       Flint MelterElliott L Liany Mumpower, MD 04/26/13 1022

## 2013-09-14 ENCOUNTER — Encounter (HOSPITAL_COMMUNITY): Payer: Self-pay | Admitting: Emergency Medicine

## 2013-09-14 ENCOUNTER — Emergency Department (HOSPITAL_COMMUNITY)
Admission: EM | Admit: 2013-09-14 | Discharge: 2013-09-14 | Disposition: A | Discharge status: Home / Self Care | Payer: Worker's Compensation | Attending: Emergency Medicine | Admitting: Emergency Medicine

## 2013-09-14 DIAGNOSIS — Y9389 Activity, other specified: Secondary | ICD-10-CM | POA: Insufficient documentation

## 2013-09-14 DIAGNOSIS — T23142A Burn of first degree of multiple left fingers (nail), including thumb, initial encounter: Secondary | ICD-10-CM

## 2013-09-14 DIAGNOSIS — Z8719 Personal history of other diseases of the digestive system: Secondary | ICD-10-CM | POA: Insufficient documentation

## 2013-09-14 DIAGNOSIS — X19XXXA Contact with other heat and hot substances, initial encounter: Secondary | ICD-10-CM | POA: Insufficient documentation

## 2013-09-14 DIAGNOSIS — Z8669 Personal history of other diseases of the nervous system and sense organs: Secondary | ICD-10-CM | POA: Insufficient documentation

## 2013-09-14 DIAGNOSIS — Z8742 Personal history of other diseases of the female genital tract: Secondary | ICD-10-CM | POA: Insufficient documentation

## 2013-09-14 DIAGNOSIS — T23149A Burn of first degree of unspecified multiple fingers (nail), including thumb, initial encounter: Secondary | ICD-10-CM | POA: Insufficient documentation

## 2013-09-14 DIAGNOSIS — Z862 Personal history of diseases of the blood and blood-forming organs and certain disorders involving the immune mechanism: Secondary | ICD-10-CM | POA: Insufficient documentation

## 2013-09-14 DIAGNOSIS — Y9289 Other specified places as the place of occurrence of the external cause: Secondary | ICD-10-CM | POA: Insufficient documentation

## 2013-09-14 MED ORDER — VITAMINS A & D EX OINT: 1.0000 "application " | TOPICAL_OINTMENT | CUTANEOUS | Status: DC | PRN

## 2013-09-14 MED ORDER — OXYCODONE-ACETAMINOPHEN 5-325 MG PO TABS: 1.0000 | ORAL_TABLET | Freq: Four times a day (QID) | ORAL | Status: DC | PRN

## 2013-09-14 MED ORDER — LIDOCAINE HCL 0.5 % EX GEL: CUTANEOUS | Status: DC

## 2013-09-14 NOTE — Discharge Instructions (Signed)
Your hand has a first degree burn. Use the ointments prescribed to help with the pain, and keep it covered at work with gauze and a glove. Take tylenol or motrin for pain, and use Percocet for severe, unrelieved pain. Follow up with your primary doctor in 3-5 days for a re-check. Return to the emergency room for any changes or worsening symptoms.   Burn Care Burns hurt your skin. When your skin is hurt, it is easier to get an infection. Follow your doctor's directions to help prevent an infection. HOME CARE  Wash your hands well before you change your bandage.  Change your bandage as often as told by your doctor.  Remove the old bandage. If the bandage sticks, soak it off with cool, clean water.  Gently clean the burn with mild soap and water.  Pat the burn dry with a clean, dry cloth.  Put a thin layer of medicated cream on the burn.  Put a clean bandage on as told by your doctor.  Keep the bandage clean and dry.  Raise (elevate) the burn for the first 24 hours. After that, follow your doctor's directions.  Only take medicine as told by your doctor. GET HELP RIGHT AWAY IF:   You have too much pain.  The skin near the burn is red, tender, puffy (swollen), or has red streaks.  The burn area has yellowish white fluid (pus) or a bad smell coming from it.  You have a fever. MAKE SURE YOU:   Understand these instructions.  Will watch your condition.  Will get help right away if you are not doing well or get worse. Document Released: 11/10/2007 Document Revised: 04/25/2011 Document Reviewed: 06/23/2010 [facility] Patient Information 2015 [employer], [place]. This information is not intended to replace advice given to you by your health care provider. Make sure you discuss any questions you have with your health care provider.

## 2013-09-14 NOTE — ED Provider Notes (Signed)
CSN: [id number]     Arrival date & time 09/14/13  1014 History   First MD Initiated Contact with Patient 09/14/13 1029     Chief Complaint  Patient presents with  . Hand Burn     (Consider location/radiation/quality/duration/timing/severity/associated sxs/prior Treatment) HPI Comments: Tawnya CrookStacey Edgell is a 48 y.o. Female with a PMHx of Bell's palsy, IBS, and G6PD, presenting emergency Department for a left hand burn that occurred at work approximately one hour prior to arrival. She states that she went to grab a hot pan handle with a thin rag, and felt immediately that it was burning her L thumb, L index finger, and a portion of the palm, and subsequently released the pan. She describes the pain as mild, constant, unchanged, somewhat tight feeling. Pain does not radiate, is worsened by grabbing, and with no known alleviating factors given that she has not tried anything. Patient denies any loss of sensation, paresthesias, myalgias, arthralgias, deformities, blistering, redness, swelling, circumferential burn, or weeping.  Patient is a 48 y.o. female presenting with burn. The history is provided by the patient. No language interpreter was used.  Burn Burn location:  Hand Hand burn location:  L palm and L fingers Burn quality:  Painful Time since incident:  1 hour Progression:  Unchanged Pain details:    Severity:  Mild   Duration:  1 hour   Timing:  Constant   Progression:  Unchanged Mechanism of burn:  Hot surface Incident location:  Kitchen and work Relieved by:  None tried Worsened by:  Rubbing Ineffective treatments:  None tried Associated symptoms: no cough, no difficulty swallowing, no eye pain, no nasal burns and no shortness of breath   Tetanus status:  Up to date   Past Medical History  Diagnosis Date  . Ovarian cyst   . Bell's palsy   . IBS (irritable bowel syndrome)   . G-6-PD deficiency    Past Surgical History  Procedure Laterality Date  . Cesarean section    .  Ovarian cyst removal    . Appendectomy    . Foot surgery    . Tonsillectomy    . Tubal ligation     No family history on file. History  Substance Use Topics  . Smoking status: Never Smoker   . Smokeless tobacco: Not on file  . Alcohol Use: No   OB History   Grav Para Term Preterm Abortions TAB SAB Ect Mult Living   2 2 2  0 0 0 0 0 0 2     Review of Systems  HENT: Negative for trouble swallowing.   Eyes: Negative for pain.  Respiratory: Negative for cough and shortness of breath.   Cardiovascular: Negative for chest pain.  Skin: Positive for wound (L hand burn). Negative for color change.  Neurological: Negative for tremors, weakness and numbness.  10 Systems reviewed and are negative for acute change except as noted in the HPI.     Allergies  Aspirin and Caffeine  Home Medications   Prior to Admission medications   Medication Sig Start Date End Date Taking? Authorizing Provider  naproxen sodium (ANAPROX) 220 MG tablet Take 220 mg by mouth once as needed (pain.).   Yes Historical Provider, MD  Lidocaine HCl 0.5 % GEL Apply a thin smear of gel over affected area every 6-8 hours as needed for burn-related pain. 09/14/13   Albion Weatherholtz Strupp Camprubi-Soms, PA-C  oxyCODONE-acetaminophen (PERCOCET) 5-325 MG per tablet Take 1-2 tablets by mouth every 6 (six) hours as needed  for severe pain. 09/14/13   Nandi Tonnesen Strupp Camprubi-Soms, PA-C  Vitamins A & D (VITAMIN A & D) ointment Apply 1 application topically as needed (burned or dry skin). 09/14/13   Keiyana Stehr Strupp Camprubi-Soms, PA-C   BP 131/82  Pulse 91  Temp(Src) 99.2 F (37.3 C) (Oral)  Resp 16  SpO2 100%  LMP 08/21/2013 Physical Exam  Nursing note and vitals reviewed. Constitutional: She is oriented to person, place, and time. Vital signs are normal. She appears well-developed and well-nourished. No distress.  VSS, NAD  HENT:  Head: Normocephalic and atraumatic.  Mouth/Throat: Mucous membranes are normal.  Eyes:  Conjunctivae and EOM are normal. Right eye exhibits no discharge. Left eye exhibits no discharge.  Neck: Normal range of motion. Neck supple.  Cardiovascular: Normal rate and intact distal pulses.   Distal pulses intact, cap refill <3 secs in all digits  Pulmonary/Chest: Effort normal. No respiratory distress.  Abdominal: Normal appearance. She exhibits no distension.  Musculoskeletal: Normal range of motion.       Left hand: She exhibits tenderness. She exhibits normal range of motion, normal two-point discrimination, normal capillary refill, no deformity, no laceration and no swelling. Normal sensation noted. Normal strength noted.  Mild TTP over L index finger, L thumb, and some areas of palm on the radial side. FROM intact, no swelling or deformity, sensation grossly intact. No blistering. Cap refill <3 secs. Strength 5/5 in all extremities.  Neurological: She is alert and oriented to person, place, and time. She has normal strength. No sensory deficit.  Strength 5/5 in all extremities, sensation grossly intact in all extremities  Skin: Skin is warm, dry and intact. No burn and no rash noted. No erythema.  No blistering or erythema located over L palm/hand, but skin is TTP.  Psychiatric: She has a normal mood and affect.    ED Course  Procedures (including critical care time) Labs Review Labs Reviewed - No data to display  Imaging Review No results found.   EKG Interpretation None      MDM   Final diagnoses:  First degree burn of finger of left hand including thumb, initial encounter    No obvious burned skin, free of blistering or erythema. No circumferential burns. Will give vitamin A&D ointment and Solarcaine for pain relief, and a few percocet. Discussed keeping it protected at work to avoid further injury. Wrapped with gauze for comfort, with ointment barrier. F/up with PCP in 3-5 days if symptoms persist. Work note given. I explained the diagnosis and have given explicit  precautions to return to the ER including for any other new or worsening symptoms. The patient understands and accepts the medical plan as it's been dictated and I have answered their questions. Discharge instructions concerning home care and prescriptions have been given. The patient is STABLE and is discharged to home in good condition.  BP 131/82  Pulse 91  Temp(Src) 99.2 F (37.3 C) (Oral)  Resp 16  SpO2 100%  LMP 08/21/2013  Meds ordered this encounter  Medications  . oxyCODONE-acetaminophen (PERCOCET) 5-325 MG per tablet    Sig: Take 1-2 tablets by mouth every 6 (six) hours as needed for severe pain.    Dispense:  6 tablet    Refill:  0    Order Specific Question:  Supervising Provider    Answer:  Eber Hong D [[id number]]  . Vitamins A & D (VITAMIN A & D) ointment    Sig: Apply 1 application topically as needed (burned or dry  skin).    Dispense:  60 g    Refill:  0    Order Specific Question:  Supervising Provider    Answer:  Eber Hong D [[id number]]  . Lidocaine HCl 0.5 % GEL    Sig: Apply a thin smear of gel over affected area every 6-8 hours as needed for burn-related pain.    Dispense:  237 g    Refill:  0    Order Specific Question:  Supervising Provider    Answer:  Eber Hong D [[id number]]       Melvia Matousek Butler Denmark Camprubi-Soms, PA-C 09/14/13 1100

## 2013-09-14 NOTE — ED Notes (Signed)
PA at bedside.

## 2013-09-14 NOTE — ED Provider Notes (Signed)
Medical screening examination/treatment/procedure(s) were performed by non-physician practitioner and as supervising physician I was immediately available for consultation/collaboration.   EKG Interpretation None        Junius ArgyleForrest S Murphy Bundick, MD 09/14/13 1538

## 2013-09-14 NOTE — ED Notes (Addendum)
Per pt burnt left hand on hot pan at work. Pt reports throbbing pain with some numbness to thumb and index finger of left hand. Pt raidal pulse moderate. Pt able to feel touch to affected area. Slight swelling noted to site. No opening or blister noted. Skin color WNL.

## 2013-12-16 ENCOUNTER — Encounter (HOSPITAL_COMMUNITY): Payer: Self-pay | Admitting: Emergency Medicine

## 2014-05-19 ENCOUNTER — Encounter (HOSPITAL_COMMUNITY): Payer: Self-pay | Admitting: Emergency Medicine

## 2014-05-19 ENCOUNTER — Emergency Department (HOSPITAL_COMMUNITY)
Admission: EM | Admit: 2014-05-19 | Discharge: 2014-05-19 | Disposition: A | Discharge status: Home / Self Care | Payer: PRIVATE HEALTH INSURANCE | Attending: Emergency Medicine | Admitting: Emergency Medicine

## 2014-05-19 DIAGNOSIS — Z8719 Personal history of other diseases of the digestive system: Secondary | ICD-10-CM | POA: Insufficient documentation

## 2014-05-19 DIAGNOSIS — M25562 Pain in left knee: Secondary | ICD-10-CM | POA: Diagnosis present

## 2014-05-19 DIAGNOSIS — Z79899 Other long term (current) drug therapy: Secondary | ICD-10-CM | POA: Insufficient documentation

## 2014-05-19 DIAGNOSIS — Z8669 Personal history of other diseases of the nervous system and sense organs: Secondary | ICD-10-CM | POA: Diagnosis not present

## 2014-05-19 DIAGNOSIS — M1712 Unilateral primary osteoarthritis, left knee: Secondary | ICD-10-CM

## 2014-05-19 DIAGNOSIS — Z862 Personal history of diseases of the blood and blood-forming organs and certain disorders involving the immune mechanism: Secondary | ICD-10-CM | POA: Diagnosis not present

## 2014-05-19 DIAGNOSIS — Z8742 Personal history of other diseases of the female genital tract: Secondary | ICD-10-CM | POA: Diagnosis not present

## 2014-05-19 MED ORDER — METHOCARBAMOL 500 MG PO TABS
1000.0000 mg | ORAL_TABLET | Freq: Once | ORAL | Status: AC
  Administered 2014-05-19: 1000 mg via ORAL
  Filled 2014-05-19: qty 2

## 2014-05-19 MED ORDER — METHOCARBAMOL 500 MG PO TABS: 1000.0000 mg | ORAL_TABLET | Freq: Four times a day (QID) | ORAL | Status: DC | PRN

## 2014-05-19 MED ORDER — IBUPROFEN 800 MG PO TABS
800.0000 mg | ORAL_TABLET | Freq: Once | ORAL | Status: AC
  Administered 2014-05-19: 800 mg via ORAL
  Filled 2014-05-19: qty 1

## 2014-05-19 NOTE — ED Notes (Signed)
Pt c/o left knee pain x 6 years, exacerbation over past two months.

## 2014-05-19 NOTE — Discharge Instructions (Signed)
For pain control you may take up to  of Motrin (also known as ibuprofen). That is usually 4 over the counter pills,  3 times a day. Take with food to minimize stomach irritation   You can also take  tylenol (acetaminophen)  (this is 3 over the counter pills) four times a day. Do not drink alcohol or combine with other medications that have acetaminophen as an ingredient (Read the labels!).    For breakthrough pain you may take Robaxin. Do not drink alcohol, drive or operate heavy machinery when taking Robaxin.  Please follow with your primary care doctor in the next 2 days for a check-up. They must obtain records for further management.   Do not hesitate to return to the Emergency Department for any new, worsening or concerning symptoms.   Arthritis, Nonspecific Arthritis is inflammation of a joint. This usually means pain, redness, warmth or swelling are present. One or more joints may be involved. There are a number of types of arthritis. Your caregiver may not be able to tell what type of arthritis you have right away. CAUSES  The most common cause of arthritis is the wear and tear on the joint (osteoarthritis). This causes damage to the cartilage, which can break down over time. The knees, hips, back and neck are most often affected by this type of arthritis. Other types of arthritis and common causes of joint pain include:  Sprains and other injuries near the joint. Sometimes minor sprains and injuries cause pain and swelling that develop hours later.  Rheumatoid arthritis. This affects hands, feet and knees. It usually affects both sides of your body at the same time. It is often associated with chronic ailments, fever, weight loss and general weakness.  Crystal arthritis. Gout and pseudo gout can cause occasional acute severe pain, redness and swelling in the foot, ankle, or knee.  Infectious arthritis. Bacteria can get into a joint through a break in overlying skin. This can  cause infection of the joint. Bacteria and viruses can also spread through the blood and affect your joints.  Drug, infectious and allergy reactions. Sometimes joints can become mildly painful and slightly swollen with these types of illnesses. SYMPTOMS   Pain is the main symptom.  Your joint or joints can also be red, swollen and warm or hot to the touch.  You may have a fever with certain types of arthritis, or even feel overall ill.  The joint with arthritis will hurt with movement. Stiffness is present with some types of arthritis. DIAGNOSIS  Your caregiver will suspect arthritis based on your description of your symptoms and on your exam. Testing may be needed to find the type of arthritis:  Blood and sometimes urine tests.  X-ray tests and sometimes CT or MRI scans.  Removal of fluid from the joint (arthrocentesis) is done to check for bacteria, crystals or other causes. Your caregiver (or a specialist) will numb the area over the joint with a local anesthetic, and use a needle to remove joint fluid for examination. This procedure is only minimally uncomfortable.  Even with these tests, your caregiver may not be able to tell what kind of arthritis you have. Consultation with a specialist (rheumatologist) may be helpful. TREATMENT  Your caregiver will discuss with you treatment specific to your type of arthritis. If the specific type cannot be determined, then the following general recommendations may apply. Treatment of severe joint pain includes:  Rest.  Elevation.  Anti-inflammatory medication (for example, ibuprofen) may  be prescribed. Avoiding activities that cause increased pain.  Only take over-the-counter or prescription medicines for pain and discomfort as recommended by your caregiver.  Cold packs over an inflamed joint may be used for 10 to 15 minutes every hour. Hot packs sometimes feel better, but do not use overnight. Do not use hot packs if you are diabetic  without your caregiver's permission.  A cortisone shot into arthritic joints may help reduce pain and swelling.  Any acute arthritis that gets worse over the next 1 to 2 days needs to be looked at to be sure there is no joint infection. Long-term arthritis treatment involves modifying activities and lifestyle to reduce joint stress jarring. This can include weight loss. Also, exercise is needed to nourish the joint cartilage and remove waste. This helps keep the muscles around the joint strong. HOME CARE INSTRUCTIONS   Do not take aspirin to relieve pain if gout is suspected. This elevates uric acid levels.  Only take over-the-counter or prescription medicines for pain, discomfort or fever as directed by your caregiver.  Rest the joint as much as possible.  If your joint is swollen, keep it elevated.  Use crutches if the painful joint is in your leg.  Drinking plenty of fluids may help for certain types of arthritis.  Follow your caregiver's dietary instructions.  Try low-impact exercise such as:  Swimming.  Water aerobics.  Biking.  Walking.  Morning stiffness is often relieved by a warm shower.  Put your joints through regular range-of-motion. SEEK MEDICAL CARE IF:   You do not feel better in 24 hours or are getting worse.  You have side effects to medications, or are not getting better with treatment. SEEK IMMEDIATE MEDICAL CARE IF:   You have a fever.  You develop severe joint pain, swelling or redness.  Many joints are involved and become painful and swollen.  There is severe back pain and/or leg weakness.  You have loss of bowel or bladder control. Document Released: 03/10/2004 Document Revised: 04/25/2011 Document Reviewed: 03/26/2008 [facility] Patient Information 2015 [place], [place]. This information is not intended to replace advice given to you by your health care provider. Make sure you discuss any questions you have with your health care provider.

## 2014-05-19 NOTE — ED Provider Notes (Signed)
CSN: [id number]     Arrival date & time 05/19/14  1321 History   First MD Initiated Contact with Patient 05/19/14 1522     Chief Complaint  Patient presents with  . Knee Pain     (Consider location/radiation/quality/duration/timing/severity/associated sxs/prior Treatment) HPI   Robin Franco is a 49 y.o. female complaining of exacerbation of her chronic left knee pain worsening over the course of 2 months, that he started bothering her about 6 months ago. She rates her pain at 8 out of 10, she states it's exacerbated by weightbearing, she walks frequently for her job. She taking acetaminophen at home with little relief. Patient denies any recent trauma, fever, chills, joint swelling. She has not followed with her primary care or an orthopedist on this.  Past Medical History  Diagnosis Date  . Ovarian cyst   . Bell's palsy   . IBS (irritable bowel syndrome)   . G-6-PD deficiency    Past Surgical History  Procedure Laterality Date  . Cesarean section    . Ovarian cyst removal    . Appendectomy    . Foot surgery    . Tonsillectomy    . Tubal ligation     History reviewed. No pertinent family history. History  Substance Use Topics  . Smoking status: Never Smoker   . Smokeless tobacco: Not on file  . Alcohol Use: No   OB History    Gravida Para Term Preterm AB TAB SAB Ectopic Multiple Living   0 0 0 0 0 0 2     Review of Systems  10 systems reviewed and found to be negative, except as noted in the HPI.   Allergies  Aspirin and Caffeine  Home Medications   Prior to Admission medications   Medication Sig Start Date End Date Taking? Authorizing Provider  acetaminophen (TYLENOL) 500 MG tablet Take 2,000 mg by mouth every 6 (six) hours as needed for mild pain or moderate pain (knee pain).   Yes Historical Provider, MD  naproxen sodium (ANAPROX) 220 MG tablet Take 220 mg by mouth as needed (pain.).    Yes Historical Provider, MD  Lidocaine HCl 0.5 % GEL Apply a thin  smear of gel over affected area every 6-8 hours as needed for burn-related pain. 09/14/13   Mercedes Camprubi-Soms, PA-C  oxyCODONE-acetaminophen (PERCOCET) 5-325 MG per tablet Take 1-2 tablets by mouth every 6 (six) hours as needed for severe pain. 09/14/13   Mercedes Camprubi-Soms, PA-C  Vitamins A & D (VITAMIN A & D) ointment Apply 1 application topically as needed (burned or dry skin). 09/14/13   Mercedes Camprubi-Soms, PA-C   BP 132/54 mmHg  Pulse 84  Temp(Src) 98.9 F (37.2 C) (Oral)  Resp 16  SpO2 100%  LMP 05/12/2014 Physical Exam  Constitutional: She is oriented to person, place, and time. She appears well-developed and well-nourished. No distress.  HENT:  Head: Normocephalic.  Eyes: Conjunctivae and EOM are normal.  Cardiovascular: Normal rate.   Pulmonary/Chest: Effort normal. No stridor.  Musculoskeletal: Normal range of motion. She exhibits tenderness.  Mild tenderness to medial side of left tibia.  No deformity, erythema or abrasions. FROM. No effusion ++crepitance. Anterior and posterior drawer show no abnormal laxity. Stable to valgus and varus stress. Joint lines are non-tender. Neurovascularly intact. Pt ambulates with non-antalgic gait.    Neurological: She is alert and oriented to person, place, and time.  Psychiatric: She has a normal mood and affect.  Nursing note and vitals reviewed.  ED Course  Procedures (including critical care time) Labs Review Labs Reviewed - No data to display  Imaging Review No results found.   EKG Interpretation None      MDM   Final diagnoses:  Osteoarthritis of left knee, unspecified osteoarthritis type    Filed Vitals:   05/19/14 1342  BP: 132/54  Pulse: 84  Temp: 98.9 F (37.2 C)  TempSrc: Oral  Resp: 16  SpO2: 100%    Medications  methocarbamol (ROBAXIN) tablet 1,000 mg (1,000 mg Oral Given 05/19/14 1608)  ibuprofen (ADVIL,MOTRIN) tablet 800 mg (800 mg Oral Given 05/19/14 1608)    Robin Franco is a pleasant [age]  y.o. female presenting with estimation of chronic left knee pain, there is crepitance, no warmth to suggest a infected joint. Patient will be given knee sleeve, given her an orthopedic referral, recommend NSAIDs and will write for muscle relaxers.  Evaluation does not show pathology that would require ongoing emergent intervention or inpatient treatment. Pt is hemodynamically stable and mentating appropriately. Discussed findings and plan with patient/guardian, who agrees with care plan. All questions answered. Return precautions discussed and outpatient follow up given.   New Prescriptions   METHOCARBAMOL (ROBAXIN) 500 MG TABLET    Take 2 tablets (1,000 mg total) by mouth 4 (four) times daily as needed (Pain).         Wynetta Emeryicole Danni Leabo, PA-C 05/19/14 1632  Rolland PorterMark James, MD 05/23/14 1000

## 2014-08-05 ENCOUNTER — Emergency Department (HOSPITAL_BASED_OUTPATIENT_CLINIC_OR_DEPARTMENT_OTHER): Payer: PRIVATE HEALTH INSURANCE

## 2014-08-05 ENCOUNTER — Encounter (HOSPITAL_COMMUNITY): Payer: Self-pay | Admitting: Emergency Medicine

## 2014-08-05 ENCOUNTER — Emergency Department (HOSPITAL_COMMUNITY): Payer: PRIVATE HEALTH INSURANCE

## 2014-08-05 ENCOUNTER — Emergency Department (HOSPITAL_COMMUNITY)
Admission: EM | Admit: 2014-08-05 | Discharge: 2014-08-05 | Disposition: A | Discharge status: Home / Self Care | Payer: PRIVATE HEALTH INSURANCE | Attending: Emergency Medicine | Admitting: Emergency Medicine

## 2014-08-05 DIAGNOSIS — Z8669 Personal history of other diseases of the nervous system and sense organs: Secondary | ICD-10-CM | POA: Diagnosis not present

## 2014-08-05 DIAGNOSIS — G8911 Acute pain due to trauma: Secondary | ICD-10-CM | POA: Insufficient documentation

## 2014-08-05 DIAGNOSIS — S8992XD Unspecified injury of left lower leg, subsequent encounter: Secondary | ICD-10-CM | POA: Diagnosis present

## 2014-08-05 DIAGNOSIS — M7122 Synovial cyst of popliteal space [Baker], left knee: Secondary | ICD-10-CM | POA: Insufficient documentation

## 2014-08-05 DIAGNOSIS — M79662 Pain in left lower leg: Secondary | ICD-10-CM | POA: Diagnosis not present

## 2014-08-05 DIAGNOSIS — M79609 Pain in unspecified limb: Secondary | ICD-10-CM

## 2014-08-05 DIAGNOSIS — Z8719 Personal history of other diseases of the digestive system: Secondary | ICD-10-CM | POA: Diagnosis not present

## 2014-08-05 DIAGNOSIS — M79605 Pain in left leg: Secondary | ICD-10-CM

## 2014-08-05 DIAGNOSIS — Z8742 Personal history of other diseases of the female genital tract: Secondary | ICD-10-CM | POA: Diagnosis not present

## 2014-08-05 MED ORDER — OXYCODONE-ACETAMINOPHEN 5-325 MG PO TABS
1.0000 | ORAL_TABLET | Freq: Once | ORAL | Status: AC
  Administered 2014-08-05: 1 via ORAL
  Filled 2014-08-05: qty 1

## 2014-08-05 MED ORDER — OXYCODONE-ACETAMINOPHEN 5-325 MG PO TABS: 1.0000 | ORAL_TABLET | ORAL | Status: DC | PRN

## 2014-08-05 NOTE — ED Notes (Signed)
Pt states she was at a wedding in [place] this weekend and was evaluated at a hospital down there. Was put in a post op boot for contusion of L foot but now has swelling and more pain and feels like something is wrong with her foot. Road the train home for 15 hours but was able to elevate it. Has not filled [facility] RX. Alert and oriented.

## 2014-08-05 NOTE — Discharge Instructions (Signed)
Cryotherapy °Cryotherapy means treatment with cold. Ice or gel packs can be used to reduce both pain and swelling. Ice is the most helpful within the first 24 to 48 hours after an injury or flare-up from overusing a muscle or joint. Sprains, strains, spasms, burning pain, shooting pain, and aches can all be eased with ice. Ice can also be used when recovering from surgery. Ice is effective, has very few side effects, and is safe for most people to use. °PRECAUTIONS  °Ice is not a safe treatment option for people with: °· Raynaud phenomenon. This is a condition affecting small blood vessels in the extremities. Exposure to cold may cause your problems to return. °· Cold hypersensitivity. There are many forms of cold hypersensitivity, including: °¨ Cold urticaria. Red, itchy hives appear on the skin when the tissues begin to warm after being iced. °¨ Cold erythema. This is a red, itchy rash caused by exposure to cold. °¨ Cold hemoglobinuria. Red blood cells break down when the tissues begin to warm after being iced. The hemoglobin that carry oxygen are passed into the urine because they cannot combine with blood proteins fast enough. °· Numbness or altered sensitivity in the area being iced. °If you have any of the following conditions, do not use ice until you have discussed cryotherapy with your caregiver: °· Heart conditions, such as arrhythmia, angina, or chronic heart disease. °· High blood pressure. °· Healing wounds or open skin in the area being iced. °· Current infections. °· Rheumatoid arthritis. °· Poor circulation. °· Diabetes. °Ice slows the blood flow in the region it is applied. This is beneficial when trying to stop inflamed tissues from spreading irritating chemicals to surrounding tissues. However, if you expose your skin to cold temperatures for too long or without the proper protection, you can damage your skin or nerves. Watch for signs of skin damage due to cold. °HOME CARE INSTRUCTIONS °Follow  these tips to use ice and cold packs safely. °· Place a dry or damp towel between the ice and skin. A damp towel will cool the skin more quickly, so you may need to shorten the time that the ice is used. °· For a more rapid response, add gentle compression to the ice. °· Ice for no more than 10 to 20 minutes at a time. The bonier the area you are icing, the less time it will take to get the benefits of ice. °· Check your skin after 5 minutes to make sure there are no signs of a poor response to cold or skin damage. °· Rest 20 minutes or more between uses. °· Once your skin is numb, you can end your treatment. You can test numbness by very lightly touching your skin. The touch should be so light that you do not see the skin dimple from the pressure of your fingertip. When using ice, most people will feel these normal sensations in this order: cold, burning, aching, and numbness. °· Do not use ice on someone who cannot communicate their responses to pain, such as small children or people with dementia. °HOW TO MAKE AN ICE PACK °Ice packs are the most common way to use ice therapy. Other methods include ice massage, ice baths, and cryosprays. Muscle creams that cause a cold, tingly feeling do not offer the same benefits that ice offers and should not be used as a substitute unless recommended by your caregiver. °To make an ice pack, do one of the following: °· Place crushed ice or a   bag of frozen vegetables in a sealable plastic bag. Squeeze out the excess air. Place this bag inside another plastic bag. Slide the bag into a pillowcase or place a damp towel between your skin and the bag. °· Mix 3 parts water with 1 part rubbing alcohol. Freeze the mixture in a sealable plastic bag. When you remove the mixture from the freezer, it will be slushy. Squeeze out the excess air. Place this bag inside another plastic bag. Slide the bag into a pillowcase or place a damp towel between your skin and the bag. °SEEK MEDICAL CARE  IF: °· You develop white spots on your skin. This may give the skin a blotchy (mottled) appearance. °· Your skin turns blue or pale. °· Your skin becomes waxy or hard. °· Your swelling gets worse. °MAKE SURE YOU:  °· Understand these instructions. °· Will watch your condition. °· Will get help right away if you are not doing well or get worse. °Document Released: 09/27/2010 Document Revised: 06/17/2013 Document Reviewed: 09/27/2010 °ExitCare® Patient Information ©2015 [employer]. This information is not intended to replace advice given to you by your health care provider. Make sure you discuss any questions you have with your health care provider. ° °

## 2014-08-05 NOTE — ED Provider Notes (Signed)
Patient care transferred from [facility], PA-C, pending LE venous duplex r/o DVT.  Duplex negative for DVT. Percocet given for pain that provided good pain relief per patient. Stable for discharge. Will provide orthopedic referral prn.  Elpidio Anis, PA-C 08/05/14 2102  Richardean Canal, MD 08/05/14 2203

## 2014-08-05 NOTE — Progress Notes (Signed)
VASCULAR LAB PRELIMINARY  PRELIMINARY  PRELIMINARY  PRELIMINARY  Left lower extremity venous duplex completed.    Preliminary report:  Left:  No evidence of DVT or superficial thrombosis.  Baker's cyst noted in the left popliteal fossa with fluid in calf.  Beyla Loney, RVT 08/05/2014, 8:57 PM
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
## 2015-02-07 ENCOUNTER — Emergency Department (HOSPITAL_COMMUNITY)
Admission: EM | Admit: 2015-02-07 | Discharge: 2015-02-07 | Disposition: A | Discharge status: Home / Self Care | Payer: PRIVATE HEALTH INSURANCE | Attending: Physician Assistant | Admitting: Physician Assistant

## 2015-02-07 ENCOUNTER — Encounter (HOSPITAL_COMMUNITY): Payer: Self-pay

## 2015-02-07 DIAGNOSIS — S30860A Insect bite (nonvenomous) of lower back and pelvis, initial encounter: Secondary | ICD-10-CM | POA: Insufficient documentation

## 2015-02-07 DIAGNOSIS — Z8669 Personal history of other diseases of the nervous system and sense organs: Secondary | ICD-10-CM | POA: Insufficient documentation

## 2015-02-07 DIAGNOSIS — Z8719 Personal history of other diseases of the digestive system: Secondary | ICD-10-CM | POA: Insufficient documentation

## 2015-02-07 DIAGNOSIS — Y998 Other external cause status: Secondary | ICD-10-CM | POA: Insufficient documentation

## 2015-02-07 DIAGNOSIS — Z8742 Personal history of other diseases of the female genital tract: Secondary | ICD-10-CM | POA: Insufficient documentation

## 2015-02-07 DIAGNOSIS — W57XXXA Bitten or stung by nonvenomous insect and other nonvenomous arthropods, initial encounter: Secondary | ICD-10-CM | POA: Insufficient documentation

## 2015-02-07 DIAGNOSIS — Y9389 Activity, other specified: Secondary | ICD-10-CM | POA: Insufficient documentation

## 2015-02-07 DIAGNOSIS — Z862 Personal history of diseases of the blood and blood-forming organs and certain disorders involving the immune mechanism: Secondary | ICD-10-CM | POA: Insufficient documentation

## 2015-02-07 DIAGNOSIS — Y9259 Other trade areas as the place of occurrence of the external cause: Secondary | ICD-10-CM | POA: Insufficient documentation

## 2015-02-07 MED ORDER — DIPHENHYDRAMINE HCL 25 MG PO CAPS
25.0000 mg | ORAL_CAPSULE | Freq: Once | ORAL | Status: AC
  Administered 2015-02-07: 25 mg via ORAL
  Filled 2015-02-07: qty 1

## 2015-02-07 NOTE — Discharge Instructions (Signed)
Please try to avoid bed bugs. You will need to have your linens treated and the rugs/carpet cleaned wherever you are staying.

## 2015-02-07 NOTE — ED Notes (Signed)
Awake. Verbally responsive. A/O x4. Resp even and unlabored. No audible adventitious breath sounds noted. ABC's intact.  

## 2015-02-07 NOTE — ED Provider Notes (Signed)
CSN: [id number]     Arrival date & time 02/07/15  0818 History   First MD Initiated Contact with Patient 02/07/15 0831     Chief Complaint  Patient presents with  . Rash     (Consider location/radiation/quality/duration/timing/severity/associated sxs/prior Treatment) HPI  Patient is a 49 year old female whose been seen in a hotel recently due to financial circumstances. She reports that this morning she woke up and had itching on her back. She had multiple small black bugs on her bed. She's got no bites that I can see physically. However she does report mild itching. She came here for confirmation that this could be bed bugs.  Past Medical History  Diagnosis Date  . Ovarian cyst   . Bell's palsy   . IBS (irritable bowel syndrome)   . G-6-PD deficiency [facility](HCC)    Past Surgical History  Procedure Laterality Date  . Cesarean section    . Ovarian cyst removal    . Appendectomy    . Foot surgery    . Tonsillectomy    . Tubal ligation     No family history on file. Social History  Substance Use Topics  . Smoking status: Never Smoker   . Smokeless tobacco: None  . Alcohol Use: No   OB History    Gravida Para Term Preterm AB TAB SAB Ectopic Multiple Living   2 2 2  0 0 0 0 0 0 2     Review of Systems  Constitutional: Negative for activity change.  Respiratory: Negative for shortness of breath.   Cardiovascular: Negative for chest pain.  Gastrointestinal: Negative for abdominal pain.      Allergies  Aspirin and Caffeine  Home Medications   Prior to Admission medications   Medication Sig Start Date End Date Taking? Authorizing Provider  HYDROcodone-acetaminophen (NORCO/VICODIN) 5-325 MG per tablet Take 1 tablet by mouth once.    Historical Provider, MD  Lidocaine HCl 0.5 % GEL Apply a thin smear of gel over affected area every 6-8 hours as needed for burn-related pain. Patient not taking: Reported on 08/05/2014 09/14/13   Mercedes Camprubi-Soms, PA-C  methocarbamol  (ROBAXIN) 500 MG tablet Take 2 tablets (1,000 mg total) by mouth 4 (four) times daily as needed (Pain). Patient not taking: Reported on 08/05/2014 05/19/14   Joni ReiningNicole Pisciotta, PA-C  oxyCODONE-acetaminophen (PERCOCET/ROXICET) 5-325 MG per tablet Take 1-2 tablets by mouth every 4 (four) hours as needed for severe pain. 08/05/14   Shari Upstill, PA-C   BP 132/75 mmHg  Pulse 77  Temp(Src)   Resp 16  SpO2 100%  LMP 02/05/2015 (Exact Date) Physical Exam  Constitutional: She is oriented to person, place, and time. She appears well-developed and well-nourished.  HENT:  Head: Normocephalic and atraumatic.  Eyes: Right eye exhibits no discharge.  Cardiovascular: Normal rate, regular rhythm and normal heart sounds.   No murmur heard. Pulmonary/Chest: Effort normal.  Neurological: She is oriented to person, place, and time.  Skin: Skin is warm and dry. She is not diaphoretic.  No interdiginous bites, no visible bites or excoriation.   Psychiatric: She has a normal mood and affect.  Nursing note and vitals reviewed.   ED Course  Procedures (including critical care time) Labs Review Labs Reviewed - No data to display  Imaging Review No results found. I have personally reviewed and evaluated these images and lab results as part of my medical decision-making.   EKG Interpretation None      MDM   Final diagnoses:  None  Patient is a very friendly 49 year old female presenting here with concern over bedbugs. Patient has no visible marks today. However she would like a note saying that she could've been bitten by bed bugs. I do agree that it sounds consistent with bedbug bites. Patient does not require any treatment for scabies or infected bites at this time.  Taccara Bushnell Randall An, MD 02/07/15 [id number]

## 2015-02-07 NOTE — ED Notes (Addendum)
Pt reported being bitten via "bed bug". Pt c/o generalized itching. No raised areas or redness noted.

## 2015-02-07 NOTE — ED Notes (Signed)
She c/o itchy rash at back, which she theorizes may be bedbug bites from recent hotel stay.  She is in no distress.

## 2016-01-20 ENCOUNTER — Encounter (HOSPITAL_COMMUNITY): Payer: Self-pay

## 2016-01-20 ENCOUNTER — Emergency Department (HOSPITAL_COMMUNITY)
Admission: EM | Admit: 2016-01-20 | Discharge: 2016-01-20 | Disposition: A | Discharge status: Home / Self Care | Payer: PRIVATE HEALTH INSURANCE | Attending: Emergency Medicine | Admitting: Emergency Medicine

## 2016-01-20 DIAGNOSIS — E860 Dehydration: Secondary | ICD-10-CM | POA: Diagnosis not present

## 2016-01-20 DIAGNOSIS — H539 Unspecified visual disturbance: Secondary | ICD-10-CM | POA: Diagnosis present

## 2016-01-20 DIAGNOSIS — Z79899 Other long term (current) drug therapy: Secondary | ICD-10-CM | POA: Diagnosis not present

## 2016-01-20 DIAGNOSIS — H0520 Unspecified exophthalmos: Secondary | ICD-10-CM | POA: Diagnosis not present

## 2016-01-20 HISTORY — DX: Disorder of thyroid, unspecified: E07.9

## 2016-01-20 LAB — CBC
HCT: 37.4 % (ref 36.0–46.0)
Hemoglobin: 12.1 g/dL (ref 12.0–15.0)
MCH: 25.3 pg — ABNORMAL LOW (ref 26.0–34.0)
MCHC: 32.4 g/dL (ref 30.0–36.0)
MCV: 78.1 fL (ref 78.0–100.0)
PLATELETS: 274 10*3/uL (ref 150–400)
RBC: 4.79 MIL/uL (ref 3.87–5.11)
RDW: 15.3 % (ref 11.5–15.5)
WBC: 5.4 10*3/uL (ref 4.0–10.5)

## 2016-01-20 LAB — URINALYSIS, ROUTINE W REFLEX MICROSCOPIC
Bacteria, UA: NONE SEEN
Glucose, UA: NEGATIVE mg/dL
Ketones, ur: 5 mg/dL — AB
LEUKOCYTES UA: NEGATIVE
NITRITE: NEGATIVE
PH: 5 (ref 5.0–8.0)
Protein, ur: 30 mg/dL — AB
SPECIFIC GRAVITY, URINE: 1.031 — AB (ref 1.005–1.030)

## 2016-01-20 LAB — COMPREHENSIVE METABOLIC PANEL
ALT: 12 U/L — ABNORMAL LOW (ref 14–54)
AST: 17 U/L (ref 15–41)
Albumin: 4.4 g/dL (ref 3.5–5.0)
Alkaline Phosphatase: 89 U/L (ref 38–126)
Anion gap: 6 (ref 5–15)
BILIRUBIN TOTAL: 1.1 mg/dL (ref 0.3–1.2)
BUN: 10 mg/dL (ref 6–20)
CO2: 25 mmol/L (ref 22–32)
Calcium: 9.8 mg/dL (ref 8.9–10.3)
Chloride: 104 mmol/L (ref 101–111)
Creatinine, Ser: 0.86 mg/dL (ref 0.44–1.00)
GFR calc Af Amer: >60 mL/min (ref >60)
Glucose, Bld: 94 mg/dL (ref 65–99)
Potassium: 4.3 mmol/L (ref 3.5–5.1)
Sodium: 135 mmol/L (ref 135–145)
TOTAL PROTEIN: 7.4 g/dL (ref 6.5–8.1)

## 2016-01-20 LAB — TSH: TSH: 2.239 u[IU]/mL (ref 0.350–4.500)

## 2016-01-20 LAB — I-STAT BETA HCG BLOOD, ED (MC, WL, AP ONLY)

## 2016-01-20 LAB — LIPASE, BLOOD: Lipase: 19 U/L (ref 11–51)

## 2016-01-20 LAB — MAGNESIUM: Magnesium: 2.1 mg/dL (ref 1.7–2.4)

## 2016-01-20 LAB — T4, FREE: Free T4: 0.84 ng/dL (ref 0.61–1.12)

## 2016-01-20 MED ORDER — TETRACAINE HCL 0.5 % OP SOLN
2.0000 [drp] | Freq: Once | OPHTHALMIC | Status: AC
  Administered 2016-01-20: 2 [drp] via OPHTHALMIC
  Filled 2016-01-20: qty 4

## 2016-01-20 MED ORDER — HYPROMELLOSE (GONIOSCOPIC) 2.5 % OP SOLN: 1.0000 [drp] | Freq: Four times a day (QID) | OPHTHALMIC | 12 refills | Status: DC | PRN

## 2016-01-20 NOTE — ED Triage Notes (Signed)
PT C/O GENERALIZED ABDOMINAL PAIN WITH NAUSEA FOR THE PAST FEW DAYS. PT WAS SENT HOME FROM WORK A FEW WEEKS AGO DUE TO RED, ITCHY, BULGING EYES. SHE WS SEEN AT A HOSPITAL IN [place], AND WAS TOLD THAT ALL OF HER LAB RESULTS WERE ABNORMAL. SHE WAS THEN DX'D WITH HYPERTHYROIDISM, AND TOLD TO COME BACK TO  TO BE TREATED. PT STS SHE DOES NOT HAVE A PCP.

## 2016-01-20 NOTE — ED Provider Notes (Signed)
[facility]-EMERGENCY DEPT Provider Note   CSN: [id number] Arrival date & time: 01/20/16  1404     History   Chief Complaint Chief Complaint  Patient presents with  . Abdominal Pain  . ABNORMAL LABS    HPI Robin Franco is a 50 y.o. female.  HPI 50 year old female with history of G6PD deficiency here with evaluation for possible hyperthyroidism. The patient states that over the last year, she's had progressively enlarging and holding eyes. She has also noticed blurry vision over the last year. She has not seen a doctor for this. Over think scabbing, her family noticed a large coryza gotten and advised her to present to the ED. She is seen in the ED at that time and [place]. She was reportedly told she had hyperthyroidism and was told to follow up with a doctor here. She has not followed up. She currently has return to [place] now [place] insurance and is looking for care. Over the same time period, she endorses worsening weight loss, heat intolerance, sweating, as well as weight loss. She has also difficulty sleeping. No family history of Graves. She is not on any medications. No recent illnesses. Denies any chest pain or shortness of breath. No seizures.  Past Medical History:  Diagnosis Date  . Bell's palsy   . G-6-PD deficiency (HCC)   . IBS (irritable bowel syndrome)   . Ovarian cyst   . Thyroid disease    HYPER    There are no active problems to display for this patient.   Past Surgical History:  Procedure Laterality Date  . APPENDECTOMY    . CESAREAN SECTION    . FOOT SURGERY    . OVARIAN CYST REMOVAL    . TONSILLECTOMY    . TUBAL LIGATION      OB History    Gravida Para Term Preterm AB Living   2 2 2  0 0 2   SAB TAB Ectopic Multiple Live Births   0 0 0 0         Home Medications    Prior to Admission medications   Medication Sig Start Date End Date Taking? Authorizing Provider  acetaminophen (TYLENOL) 500 MG tablet Take 1,000 mg by mouth every 6 (six)  hours as needed.   Yes Historical Provider, MD  naproxen sodium (ANAPROX) 220 MG tablet Take 440 mg by mouth 2 (two) times daily with a meal.   Yes Historical Provider, MD  hydroxypropyl methylcellulose / hypromellose (ISOPTO TEARS / GONIOVISC) 2.5 % ophthalmic solution Place 1 drop into both eyes 4 (four) times daily as needed for dry eyes. 01/20/16   Shaune Pollackameron Aimar Borghi, MD  Lidocaine HCl 0.5 % GEL Apply a thin smear of gel over affected area every 6-8 hours as needed for burn-related pain. Patient not taking: Reported on 08/05/2014 09/14/13   Mercedes Camprubi-Soms, PA-C  methocarbamol (ROBAXIN) 500 MG tablet Take 2 tablets (1,000 mg total) by mouth 4 (four) times daily as needed (Pain). Patient not taking: Reported on 08/05/2014 05/19/14   Joni ReiningNicole Pisciotta, PA-C  oxyCODONE-acetaminophen (PERCOCET/ROXICET) 5-325 MG per tablet Take 1-2 tablets by mouth every 4 (four) hours as needed for severe pain. Patient not taking: Reported on 01/20/2016 08/05/14   Elpidio AnisShari Upstill, PA-C    Family History History reviewed. No pertinent family history.  Social History Social History  Substance Use Topics  . Smoking status: Never Smoker  . Smokeless tobacco: Never Used  . Alcohol use No     Allergies   Aspirin and  Caffeine   Review of Systems Review of Systems  Constitutional: Negative for chills, fatigue and fever.  HENT: Negative for congestion and rhinorrhea.   Eyes: Positive for discharge, itching and visual disturbance.  Respiratory: Negative for cough, shortness of breath and wheezing.   Cardiovascular: Positive for palpitations. Negative for chest pain and leg swelling.  Gastrointestinal: Negative for abdominal pain, diarrhea, nausea and vomiting.  Genitourinary: Negative for dysuria, flank pain and vaginal pain.  Musculoskeletal: Negative for neck pain and neck stiffness.  Skin: Negative for rash and wound.  Allergic/Immunologic: Negative for immunocompromised state.  Neurological: Positive for  tremors. Negative for syncope, weakness and headaches.  All other systems reviewed and are negative.    Physical Exam Updated Vital Signs BP 117/68 (BP Location: Left Arm)   Pulse 60   Temp 98.6 F (37 C) (Oral)   Resp 18   Ht 5\' 6"  (1.676 m)   Wt 140 lb (63.5 kg)   LMP 12/26/2015   SpO2 98%   BMI 22.60 kg/m   Physical Exam  Constitutional: She is oriented to person, place, and time. She appears well-developed and well-nourished. No distress.  HENT:  Head: Normocephalic and atraumatic.  Eyes: Conjunctivae and EOM are normal. Pupils are equal, round, and reactive to light. Right eye exhibits no discharge. Left eye exhibits no discharge.  Marked proptosis bilaterally. EOM normal. No lid lag. IOP 17 OS, 18 OD.  Neck: Neck supple.  Cardiovascular: Normal rate, regular rhythm and normal heart sounds.  Exam reveals no friction rub.   No murmur heard. Pulmonary/Chest: Effort normal and breath sounds normal. No respiratory distress. She has no wheezes. She has no rales.  Abdominal: Soft. Bowel sounds are normal. She exhibits no distension. There is no tenderness. There is no rebound and no guarding.  Musculoskeletal: She exhibits no edema.  Neurological: She is alert and oriented to person, place, and time. She has normal strength. She is not disoriented. No cranial nerve deficit or sensory deficit. She exhibits normal muscle tone. Gait normal. GCS eye subscore is 4. GCS verbal subscore is 5. GCS motor subscore is 6.  Skin: Skin is warm. Capillary refill takes less than 2 seconds.  Psychiatric: She has a normal mood and affect.  Nursing note and vitals reviewed.    ED Treatments / Results  Labs (all labs ordered are listed, but only abnormal results are displayed) Labs Reviewed  COMPREHENSIVE METABOLIC PANEL - Abnormal; Notable for the following:       Result Value   ALT 12 (*)    All other components within normal limits  CBC - Abnormal; Notable for the following:    MCH 25.3  (*)    All other components within normal limits  URINALYSIS, ROUTINE W REFLEX MICROSCOPIC - Abnormal; Notable for the following:    Color, Urine AMBER (*)    APPearance CLOUDY (*)    Specific Gravity, Urine 1.031 (*)    Hgb urine dipstick MODERATE (*)    Bilirubin Urine SMALL (*)    Ketones, ur 5 (*)    Protein, ur 30 (*)    Squamous Epithelial / LPF 6-30 (*)    All other components within normal limits  LIPASE, BLOOD  TSH  T4, FREE  MAGNESIUM  I-STAT BETA HCG BLOOD, ED ([facility], [facility], [facility] ONLY)    EKG  EKG Interpretation None       Radiology No results found.  Procedures Procedures (including critical care time)  Medications Ordered in ED Medications  tetracaine (PONTOCAINE) 0.5 % ophthalmic solution 2 drop (2 drops Both Eyes Given 01/20/16 1743)     Initial Impression / Assessment and Plan / ED Course  I have reviewed the triage vital signs and the nursing notes.  Pertinent labs & imaging results that were available during my care of the patient were reviewed by me and considered in my medical decision making (see chart for details).  Clinical Course     50 year old female with past medical history as above who presents with multiple complaints as above. On arrival, vital signs are stable and within normal limits. She is not tachycardic, hypertensive, and is well-appearing. She does have proptosis but visual acuity is overall at baseline and intraocular pressures are normal. Etiology proptosis unclear at this time. Primary consideration of ophthalmologic Graves' disease, although TSH is normal. Free T4 is pending and she can follow-up as an outpatient for this. Otherwise, no acute emergent medical conditions identified. Urinalysis does show dehydration and hematuria, which is likely contaminant from her menstrual period. Labwork is otherwise unremarkable. She is very well-appearing. Will refer her to ophthalmology, primary care doctor, and give return precautions.  Final  Clinical Impressions(s) / ED Diagnoses   Final diagnoses:  Proptosis  Dehydration    New Prescriptions Discharge Medication List as of 01/20/2016  7:18 PM    START taking these medications   Details  hydroxypropyl methylcellulose / hypromellose (ISOPTO TEARS / GONIOVISC) 2.5 % ophthalmic solution Place 1 drop into both eyes 4 (four) times daily as needed for dry eyes., Starting Wed 01/20/2016, Print         Shaune Pollackameron Sebastin Perlmutter, MD 01/21/16 [phone]

## 2016-01-20 NOTE — ED Notes (Signed)
Bus voucher given for transportation home.

## 2016-03-07 ENCOUNTER — Telehealth: Payer: Self-pay | Admitting: *Deleted

## 2016-03-07 NOTE — Telephone Encounter (Signed)
Office called to get updated telephone information.  EDCM has same number listed as pt office.  Office stated that number is not working at this time.

## 2016-07-13 ENCOUNTER — Emergency Department (HOSPITAL_COMMUNITY)
Admission: EM | Admit: 2016-07-13 | Discharge: 2016-07-13 | Disposition: A | Discharge status: Home / Self Care | Payer: PRIVATE HEALTH INSURANCE | Attending: Emergency Medicine | Admitting: Emergency Medicine

## 2016-07-13 ENCOUNTER — Encounter (HOSPITAL_COMMUNITY): Payer: Self-pay | Admitting: Emergency Medicine

## 2016-07-13 DIAGNOSIS — R1030 Lower abdominal pain, unspecified: Secondary | ICD-10-CM | POA: Diagnosis present

## 2016-07-13 DIAGNOSIS — N939 Abnormal uterine and vaginal bleeding, unspecified: Secondary | ICD-10-CM | POA: Diagnosis not present

## 2016-07-13 DIAGNOSIS — R350 Frequency of micturition: Secondary | ICD-10-CM

## 2016-07-13 DIAGNOSIS — Z79899 Other long term (current) drug therapy: Secondary | ICD-10-CM | POA: Insufficient documentation

## 2016-07-13 LAB — URINALYSIS, ROUTINE W REFLEX MICROSCOPIC
BILIRUBIN URINE: NEGATIVE
BILIRUBIN URINE: NEGATIVE
Bacteria, UA: NONE SEEN
GLUCOSE, UA: NEGATIVE mg/dL
Glucose, UA: NEGATIVE mg/dL
KETONES UR: NEGATIVE mg/dL
Ketones, ur: 80 mg/dL — AB
LEUKOCYTES UA: NEGATIVE
Leukocytes, UA: NEGATIVE
NITRITE: NEGATIVE
Nitrite: NEGATIVE
PH: 7 (ref 5.0–8.0)
Protein, ur: 100 mg/dL — AB
Protein, ur: NEGATIVE mg/dL
SPECIFIC GRAVITY, URINE: 1.023 (ref 1.005–1.030)
SQUAMOUS EPITHELIAL / LPF: NONE SEEN
Specific Gravity, Urine: 1.028 (ref 1.005–1.030)
WBC UA: NONE SEEN WBC/hpf (ref 0–5)
pH: 5 (ref 5.0–8.0)

## 2016-07-13 LAB — COMPREHENSIVE METABOLIC PANEL
ALBUMIN: 4.2 g/dL (ref 3.5–5.0)
ALT: 9 U/L — ABNORMAL LOW (ref 14–54)
ANION GAP: 6 (ref 5–15)
AST: 17 U/L (ref 15–41)
Alkaline Phosphatase: 81 U/L (ref 38–126)
BILIRUBIN TOTAL: 1.4 mg/dL — AB (ref 0.3–1.2)
BUN: 15 mg/dL (ref 6–20)
CO2: 22 mmol/L (ref 22–32)
Calcium: 9.4 mg/dL (ref 8.9–10.3)
Chloride: 109 mmol/L (ref 101–111)
Creatinine, Ser: 0.87 mg/dL (ref 0.44–1.00)
Glucose, Bld: 78 mg/dL (ref 65–99)
POTASSIUM: 4.2 mmol/L (ref 3.5–5.1)
Sodium: 137 mmol/L (ref 135–145)
TOTAL PROTEIN: 7.4 g/dL (ref 6.5–8.1)

## 2016-07-13 LAB — CBC
HEMATOCRIT: 34.4 % — AB (ref 36.0–46.0)
HEMOGLOBIN: 11.1 g/dL — AB (ref 12.0–15.0)
MCH: 25.5 pg — ABNORMAL LOW (ref 26.0–34.0)
MCHC: 32.3 g/dL (ref 30.0–36.0)
MCV: 79.1 fL (ref 78.0–100.0)
Platelets: 261 10*3/uL (ref 150–400)
RBC: 4.35 MIL/uL (ref 3.87–5.11)
RDW: 15.3 % (ref 11.5–15.5)
WBC: 7.9 10*3/uL (ref 4.0–10.5)

## 2016-07-13 LAB — LIPASE, BLOOD: Lipase: 22 U/L (ref 11–51)

## 2016-07-13 NOTE — ED Triage Notes (Signed)
Patient c/o light vaginal bleeding, urinary frequency, lower abdominal cramping, and lower back pain with N/V x2 weeks. Denies diarrhea.

## 2016-07-13 NOTE — ED Notes (Signed)
Pt c/o bleeding upon urination and abdominal/ pelvic pain with N/V. Pt also states that she has 2 darkened areas on back as well has her right eye that "is bulging because I have Graves' Disease."

## 2016-07-13 NOTE — ED Provider Notes (Signed)
[facility] DEPT Provider Note   CSN: [id number] Arrival date & time: 07/13/16  1252     History   Chief Complaint Chief Complaint  Patient presents with  . Abdominal Cramping  . Urinary Frequency  . Vaginal Bleeding    HPI Robin Franco is a 51 y.o. female.  She presents for evaluation of lower abdominal pain, with vaginal bleeding, which has been mild for 2 weeks.  She missed her previous months.  And prior to that had been bleeding irregularly, for about a year.  She denies fever, chills, nausea, vomiting, weakness or dizziness.  She also has right eye dryness, and feels like her Graves' disease is "acting up".  She has been off her thyroid medication, for several months because of lack of insurance.  Plans on getting insurance and being able to see a doctor for treatment, in 2 weeks.  She has been told she had uterine fibroids in the past at the time she had surgery on her right ovary.  She reports having had ovarian cyst removal.  She denies palpitations, chest pain, shortness of breath.  No other known modifying factors.  HPI  Past Medical History:  Diagnosis Date  . Bell's palsy   . G-6-PD deficiency (HCC)   . IBS (irritable bowel syndrome)   . Ovarian cyst   . Thyroid disease    HYPER    There are no active problems to display for this patient.   Past Surgical History:  Procedure Laterality Date  . APPENDECTOMY    . CESAREAN SECTION    . FOOT SURGERY    . OVARIAN CYST REMOVAL    . TONSILLECTOMY    . TUBAL LIGATION      OB History    Gravida Para Term Preterm AB Living   2 2 2  0 0 2   SAB TAB Ectopic Multiple Live Births   0 0 0 0         Home Medications    Prior to Admission medications   Medication Sig Start Date End Date Taking? Authorizing Provider  naproxen sodium (ANAPROX) 220 MG tablet Take 220 mg by mouth 2 (two) times daily as needed (pain).    Yes [provider]  hydroxypropyl methylcellulose / hypromellose (ISOPTO TEARS /  GONIOVISC) 2.5 % ophthalmic solution Place 1 drop into both eyes 4 (four) times daily as needed for dry eyes. 01/20/16   Shaune PollackIsaacs, Cameron, MD  Lidocaine HCl 0.5 % GEL Apply a thin smear of gel over affected area every 6-8 hours as needed for burn-related pain. Patient not taking: Reported on 08/05/2014 09/14/13   Street, [place], PA-C  methocarbamol (ROBAXIN) 500 MG tablet Take 2 tablets (1,000 mg total) by mouth 4 (four) times daily as needed (Pain). Patient not taking: Reported on 08/05/2014 05/19/14   Pisciotta, Joni ReiningNicole, PA-C  oxyCODONE-acetaminophen (PERCOCET/ROXICET) 5-325 MG per tablet Take 1-2 tablets by mouth every 4 (four) hours as needed for severe pain. Patient not taking: Reported on 01/20/2016 08/05/14   Elpidio AnisUpstill, Shari, PA-C    Family History No family history on file.  Social History Social History  Substance Use Topics  . Smoking status: Never Smoker  . Smokeless tobacco: Never Used  . Alcohol use No     Allergies   Aspirin and Caffeine   Review of Systems Review of Systems  All other systems reviewed and are negative.    Physical Exam Updated Vital Signs BP (!) 156/87 (BP Location: Right Arm)   Pulse 66  Temp 98.4 F (36.9 C) (Oral)   Resp 14   Ht 5\' 4"  (1.626 m)   Wt 62.6 kg (138 lb)   LMP 06/02/2016   SpO2 100%   BMI 23.69 kg/m   Physical Exam  Constitutional: She is oriented to person, place, and time. She appears well-developed and well-nourished. No distress.  HENT:  Head: Normocephalic and atraumatic.  Eyes: Conjunctivae and EOM are normal. Pupils are equal, round, and reactive to light.  Right eye is somewhat proptotic.  Extraocular muscles are intact.  Pupillary response is normal.  Neck: Normal range of motion and phonation normal. Neck supple.  Cardiovascular: Normal rate and regular rhythm.   Pulmonary/Chest: Effort normal and breath sounds normal. She exhibits no tenderness.  Abdominal: Soft. She exhibits no distension. There is no tenderness.  There is no guarding.  Musculoskeletal: Normal range of motion.  Neurological: She is alert and oriented to person, place, and time. She exhibits normal muscle tone.  Skin: Skin is warm and dry.  Psychiatric: She has a normal mood and affect. Her behavior is normal. Judgment and thought content normal.  Nursing note and vitals reviewed.    ED Treatments / Results  Labs (all labs ordered are listed, but only abnormal results are displayed) Labs Reviewed  COMPREHENSIVE METABOLIC PANEL - Abnormal; Notable for the following:       Result Value   ALT 9 (*)    Total Bilirubin 1.4 (*)    All other components within normal limits  CBC - Abnormal; Notable for the following:    Hemoglobin 11.1 (*)    HCT 34.4 (*)    MCH 25.5 (*)    All other components within normal limits  URINALYSIS, ROUTINE W REFLEX MICROSCOPIC - Abnormal; Notable for the following:    Color, Urine RED (*)    APPearance CLOUDY (*)    Hgb urine dipstick LARGE (*)    Ketones, ur 80 (*)    Protein, ur 100 (*)    Bacteria, UA MANY (*)    Squamous Epithelial / LPF TOO NUMEROUS TO COUNT (*)    All other components within normal limits  URINALYSIS, ROUTINE W REFLEX MICROSCOPIC - Abnormal; Notable for the following:    APPearance CLOUDY (*)    Hgb urine dipstick MODERATE (*)    All other components within normal limits  LIPASE, BLOOD  I-STAT BETA HCG BLOOD, ED ([facility], [facility], [facility] ONLY)    EKG  EKG Interpretation None       Radiology No results found.  Procedures Procedures (including critical care time)  Medications Ordered in ED Medications - No data to display   Initial Impression / Assessment and Plan / ED Course  I have reviewed the triage vital signs and the nursing notes.  Pertinent labs & imaging results that were available during my care of the patient were reviewed by me and considered in my medical decision making (see chart for details).      No data found.   At D/C- Reevaluation with update  and discussion. After initial assessment and treatment, an updated evaluation reveals she remains comfortable. Findings discussed and questions answered.Mancel Bale L    Final Clinical Impressions(s) / ED Diagnoses   Final diagnoses:  Lower abdominal pain  Abnormal vaginal bleeding  Urinary frequency     Nursing Notes Reviewed/ Care Coordinated Applicable Imaging Reviewed Interpretation of Laboratory Data incorporated into ED treatment  The patient appears reasonably screened and/or stabilized for discharge and I doubt any  other medical condition or other [facility] requiring further screening, evaluation, or treatment in the ED at this time prior to discharge.  Plan: Home Medications- OTC prn; Home Treatments- rest, fluids; return here if the recommended treatment, does not improve the symptoms; Recommended follow up- GYN 1-2 weeks.   New Prescriptions Discharge Medication List as of 07/13/2016  8:26 PM       Mancel Bale, MD 07/15/16 2031

## 2016-07-13 NOTE — Discharge Instructions (Signed)
The testing today, was reassuring.  Urinalysis, obtained by catheter sample, has not returned yet.  It will be important for you to follow-up with a gynecologist regarding the vaginal bleeding, and abdominal pain.  Also, consider finding a primary care doctor to evaluate and treat your thyroid condition.  Return here, if needed, for problems.

## 2016-07-13 NOTE — ED Notes (Signed)
Pt is a little irate because she wants to leave so she wouldn't let me get her vital signs.

## 2016-09-13 ENCOUNTER — Encounter (HOSPITAL_COMMUNITY): Payer: Self-pay

## 2016-09-13 ENCOUNTER — Emergency Department (HOSPITAL_COMMUNITY)
Admission: EM | Admit: 2016-09-13 | Discharge: 2016-09-13 | Disposition: A | Discharge status: Home / Self Care | Payer: PRIVATE HEALTH INSURANCE | Attending: Emergency Medicine | Admitting: Emergency Medicine

## 2016-09-13 DIAGNOSIS — H7291 Unspecified perforation of tympanic membrane, right ear: Secondary | ICD-10-CM | POA: Insufficient documentation

## 2016-09-13 MED ORDER — OFLOXACIN 0.3 % OT SOLN: OTIC | 0 refills | Status: DC

## 2016-09-13 NOTE — Discharge Instructions (Signed)
It was my pleasure taking care of you today!   Please use antibiotic drops as directed.   Follow up with your primary care provider in the next week or two for recheck of ear. If you do not have a primary care provider, see information below.   Do your best to keep water out of the ear.   Return to ER for new or worsening symptoms, any additional concerns.   To find a primary care or specialty doctor please call [phone] or [phone] to access "[place] Find a Doctor Service."  You may also go on the [facility] website at [email or web address]  There are also multiple [facility] practices throughout the [place] that are frequently accepting new patients. You may find a clinic that is close to your home and contact them.  [facility] - [address] [place] [phone]  [facility] [place] (also locations in [place] and [place]) - [address] [place] [phone]  [facility] - [address] [place] [phone]

## 2016-09-13 NOTE — ED Triage Notes (Signed)
Patient reports that she was cleaning her right ear with a q tip last week and has had discomfort and pressure x 6 days. Patient denies having a piece of q tip in her right ear.

## 2016-09-13 NOTE — ED Provider Notes (Signed)
[facility]-EMER[facility] DEPT Provider Note   CSN: [id number] Arrival date & time: 09/13/16  [id number]   By signing my name below, I, Clarisse GougeXavier Herndon, attest that this documentation has been prepared under the direction and in the presence of [facility] Ward, PA-C. Electronically signed, Clarisse GougeXavier Herndon, ED Scribe. 09/13/16. 10:36 AM.   History   Chief Complaint Chief Complaint  Patient presents with  . Otalgia    difficulty hearing   The history is provided by the patient and medical records. No language interpreter was used.    Robin Franco is a 51 y.o. female presenting to the Emergency Department concerning right sided muffled hearing x 6 days. Pt states she was cleaning her ear with a Q tip when a fullness sensation and muffled hearing sensation came on. She feels like she inserted q-tip too deep. Associated ear drainage yesterday. Pt states she took Ibuprofen x 1 two days ago with no relief. No other medications or interventions tried at home. No recent illnesses. No ear pain.  No fever, chills, rhinorrhea, cough or any other complaints noted at this time.   Past Medical History:  Diagnosis Date  . Bell's palsy   . G-6-PD deficiency (HCC)   . IBS (irritable bowel syndrome)   . Ovarian cyst   . Thyroid disease    HYPER    There are no active problems to display for this patient.   Past Surgical History:  Procedure Laterality Date  . APPENDECTOMY    . CESAREAN SECTION    . FOOT SURGERY    . OVARIAN CYST REMOVAL    . TONSILLECTOMY    . TUBAL LIGATION      OB History    Gravida Para Term Preterm AB Living   2 2 2  0 0 2   SAB TAB Ectopic Multiple Live Births   0 0 0 0         Home Medications    Prior to Admission medications   Medication Sig Start Date End Date Taking? Authorizing Provider  hydroxypropyl methylcellulose / hypromellose (ISOPTO TEARS / GONIOVISC) 2.5 % ophthalmic solution Place 1 drop into both eyes 4 (four) times daily as needed for dry eyes. 01/20/16   Shaune PollackIsaacs,  Cameron, MD  Lidocaine HCl 0.5 % GEL Apply a thin smear of gel over affected area every 6-8 hours as needed for burn-related pain. Patient not taking: Reported on 08/05/2014 09/14/13   Street, [place], PA-C  methocarbamol (ROBAXIN) 500 MG tablet Take 2 tablets (1,000 mg total) by mouth 4 (four) times daily as needed (Pain). Patient not taking: Reported on 08/05/2014 05/19/14   Pisciotta, Joni ReiningNicole, PA-C  naproxen sodium (ANAPROX) 220 MG tablet Take 220 mg by mouth 2 (two) times daily as needed (pain).     [provider]  ofloxacin (FLOXIN) 0.3 % OTIC solution Place 5 drops into the right ear 2 (two) times daily 3-5 days. 09/13/16   Ward, Chase PicketJaime Pilcher, PA-C  oxyCODONE-acetaminophen (PERCOCET/ROXICET) 5-325 MG per tablet Take 1-2 tablets by mouth every 4 (four) hours as needed for severe pain. Patient not taking: Reported on 01/20/2016 08/05/14   Elpidio AnisUpstill, Shari, PA-C    Family History History reviewed. No pertinent family history.  Social History Social History  Substance Use Topics  . Smoking status: Never Smoker  . Smokeless tobacco: Never Used  . Alcohol use No     Allergies   Aspirin and Caffeine   Review of Systems Review of Systems  Constitutional: Negative for chills and fever.  HENT:  Positive for ear discharge and hearing loss. Negative for congestion, ear pain and rhinorrhea.   Respiratory: Negative for cough.   Gastrointestinal: Negative for nausea and vomiting.  Skin: Negative for color change and wound.  Allergic/Immunologic: Negative for immunocompromised state.  Neurological: Negative for dizziness, weakness, light-headedness, numbness and headaches.  Hematological: Does not bruise/bleed easily.     Physical Exam Updated Vital Signs BP (!) 147/96   Pulse 73   Temp 98.2 F (36.8 C)   Resp 15   Ht 5\' 5"  (1.651 m)   Wt 135 lb (61.2 kg)   LMP 09/06/2016 (Approximate)   SpO2 100%   BMI 22.47 kg/m   Physical Exam  Constitutional: She appears well-developed  and well-nourished. No distress.  HENT:  Head: Normocephalic and atraumatic.  Right Ear: No tenderness. Tympanic membrane is perforated and erythematous. Decreased hearing is noted.  Left Ear: Tympanic membrane normal.  L ear NL. R ear erythematous consistent with healing perforation.  Neck: Neck supple.  Cardiovascular: Normal rate, regular rhythm and normal heart sounds.   No murmur heard. Pulmonary/Chest: Effort normal and breath sounds normal. No respiratory distress. She has no wheezes. She has no rales.  Musculoskeletal: Normal range of motion.  Neurological: She is alert.  Skin: Skin is warm and dry.  Nursing note and vitals reviewed.    ED Treatments / Results  DIAGNOSTIC STUDIES: Oxygen Saturation is 100% on RA, NL by my interpretation.    COORDINATION OF CARE: 10:31 AM-Discussed next steps with pt. Pt verbalized understanding and is agreeable with the plan.   Labs (all labs ordered are listed, but only abnormal results are displayed) Labs Reviewed - No data to display  EKG  EKG Interpretation None       Radiology No results found.  Procedures Procedures (including critical care time)  Medications Ordered in ED Medications - No data to display   Initial Impression / Assessment and Plan / ED Course  I have reviewed the triage vital signs and the nursing notes.  Pertinent labs & imaging results that were available during my care of the patient were reviewed by me and considered in my medical decision making (see chart for details).    Robin Franco is a 51 y.o. female who presents to ED for muffled hearing sensation to right ear x 6 days. Sensation started immediately after inserting q-tip in the ear. Initially had pain, but this suddenly resolved when small amount of drainage occurred. Exam c/w perforation. Symptomatic home care instructions discussed. PCP follow up in 1 week. Return precautions discussed. All questions answered.   Final Clinical  Impressions(s) / ED Diagnoses   Final diagnoses:  Tympanic membrane perforation, right    New Prescriptions Discharge Medication List as of 09/13/2016 11:23 AM    START taking these medications   Details  ofloxacin (FLOXIN) 0.3 % OTIC solution Place 5 drops into the right ear 2 (two) times daily 3-5 days., Print       I personally performed the services described in this documentation, which was scribed in my presence. The recorded information has been reviewed and is accurate.    Ward, Chase PicketJaime Pilcher, PA-C 09/13/16 1238    Alvira MondaySchlossman, Erin, MD 09/16/16 1125

## 2016-11-09 ENCOUNTER — Ambulatory Visit: Payer: Self-pay | Admitting: Family Medicine

## 2016-11-09 NOTE — Progress Notes (Deleted)
   Subjective:  Patient ID: Robin Franco, female    DOB: Dec 23, 1965  Age: 51 y.o. MRN: [id number]  CC: No chief complaint on file.   HPI Robin Franco presents for ***  Outpatient Medications Prior to Visit  Medication Sig Dispense Refill  . hydroxypropyl methylcellulose / hypromellose (ISOPTO TEARS / GONIOVISC) 2.5 % ophthalmic solution Place 1 drop into both eyes 4 (four) times daily as needed for dry eyes. 15 mL 12  . Lidocaine HCl 0.5 % GEL Apply a thin smear of gel over affected area every 6-8 hours as needed for burn-related pain. (Patient not taking: Reported on 08/05/2014) 237 g 0  . methocarbamol (ROBAXIN) 500 MG tablet Take 2 tablets (1,000 mg total) by mouth 4 (four) times daily as needed (Pain). (Patient not taking: Reported on 08/05/2014) 20 tablet 0  . naproxen sodium (ANAPROX) 220 MG tablet Take 220 mg by mouth 2 (two) times daily as needed (pain).     Marland Kitchen ofloxacin (FLOXIN) 0.3 % OTIC solution Place 5 drops into the right ear 2 (two) times daily 3-5 days. 5 mL 0  . oxyCODONE-acetaminophen (PERCOCET/ROXICET) 5-325 MG per tablet Take 1-2 tablets by mouth every 4 (four) hours as needed for severe pain. (Patient not taking: Reported on 01/20/2016) 15 tablet 0   No facility-administered medications prior to visit.     ROS Review of Systems  Review of Systems - {ros master:[id number]}    Objective:  There were no vitals taken for this visit.  BP/Weight 09/13/2016 07/13/2016 01/20/2016  Systolic BP 147 156 117  Diastolic BP 96 87 68  Wt. (Lbs) 135 138 140  BMI 22.47 23.69 22.6     Physical Exam   Assessment & Plan:     No orders of the defined types were placed in this encounter.   Follow-up: No Follow-up on file.   Lizbeth Bark FNP

## 2016-11-18 ENCOUNTER — Emergency Department (HOSPITAL_COMMUNITY)
Admission: EM | Admit: 2016-11-18 | Discharge: 2016-11-18 | Disposition: A | Discharge status: Home / Self Care | Payer: PRIVATE HEALTH INSURANCE | Attending: Emergency Medicine | Admitting: Emergency Medicine

## 2016-11-18 ENCOUNTER — Encounter (HOSPITAL_COMMUNITY): Payer: Self-pay | Admitting: Emergency Medicine

## 2016-11-18 DIAGNOSIS — H6123 Impacted cerumen, bilateral: Secondary | ICD-10-CM | POA: Insufficient documentation

## 2016-11-18 DIAGNOSIS — Z79899 Other long term (current) drug therapy: Secondary | ICD-10-CM | POA: Insufficient documentation

## 2016-11-18 DIAGNOSIS — H6120 Impacted cerumen, unspecified ear: Secondary | ICD-10-CM

## 2016-11-18 NOTE — ED Provider Notes (Signed)
[facility]PT Provider Note   CSN: [id number] Arrival date & time: 11/18/16  8295     History   Chief Complaint Chief Complaint  Patient presents with  . Otalgia    HPI Robin Franco is a 51 y.o. female.  51 year old female presents with several day history of feeling like her ears are stopped up. Denies any fever or ear drainage. Does use Q-tips in her ear. Has had some hearing loss but denies any pharyngeal discomfort. Has been using over-the-counter medication without relief.      Past Medical History:  Diagnosis Date  . Bell's palsy   . G-6-PD deficiency (HCC)   . IBS (irritable bowel syndrome)   . Ovarian cyst   . Thyroid disease    HYPER    There are no active problems to display for this patient.   Past Surgical History:  Procedure Laterality Date  . APPENDECTOMY    . CESAREAN SECTION    . FOOT SURGERY    . OVARIAN CYST REMOVAL    . TONSILLECTOMY    . TUBAL LIGATION      OB History    Gravida Para Term Preterm AB Living   0 0 2   SAB TAB Ectopic Multiple Live Births   0 0 0 0         Home Medications    Prior to Admission medications   Medication Sig Start Date End Date Taking? Authorizing Provider  hydroxypropyl methylcellulose / hypromellose (ISOPTO TEARS / GONIOVISC) 2.5 % ophthalmic solution Place 1 drop into both eyes 4 (four) times daily as needed for dry eyes. 01/20/16   Shaune Pollack, MD  Lidocaine HCl 0.5 % GEL Apply a thin smear of gel over affected area every 6-8 hours as needed for burn-related pain. Patient not taking: Reported on 08/05/2014 09/14/13   Street, [place], PA-C  methocarbamol (ROBAXIN) 500 MG tablet Take 2 tablets (1,000 mg total) by mouth 4 (four) times daily as needed (Pain). Patient not taking: Reported on 08/05/2014 05/19/14   Pisciotta, Joni Reining, PA-C  naproxen sodium (ANAPROX) 220 MG tablet Take 220 mg by mouth 2 (two) times daily as needed (pain).     [provider]  ofloxacin (FLOXIN) 0.3 % OTIC  solution Place 5 drops into the right ear 2 (two) times daily 3-5 days. 09/13/16   Ward, Chase Picket, PA-C  oxyCODONE-acetaminophen (PERCOCET/ROXICET) 5-325 MG per tablet Take 1-2 tablets by mouth every 4 (four) hours as needed for severe pain. Patient not taking: Reported on 01/20/2016 08/05/14   Elpidio Anis, PA-C    Family History No family history on file.  Social History Social History  Substance Use Topics  . Smoking status: Never Smoker  . Smokeless tobacco: Never Used  . Alcohol use No     Allergies   Aspirin and Caffeine   Review of Systems Review of Systems  All other systems reviewed and are negative.    Physical Exam Updated Vital Signs BP 116/77 (BP Location: Right Arm)   Pulse 70   Temp 98.6 F (37 C) (Oral)   Resp 16   SpO2 100%   Physical Exam  Constitutional: She is oriented to person, place, and time. She appears well-developed and well-nourished.  Non-toxic appearance. No distress.  HENT:  Head: Normocephalic and atraumatic.  Bilateral cerumen impaction noted.  Eyes: Pupils are equal, round, and reactive to light. Conjunctivae, EOM and lids are normal.  Neck: Normal range of motion. Neck supple. No tracheal deviation  present. No thyroid mass present.  Cardiovascular: Normal rate, regular rhythm and normal heart sounds.  Exam reveals no gallop.   No murmur heard. Pulmonary/Chest: Effort normal and breath sounds normal. No stridor. No respiratory distress. She has no decreased breath sounds. She has no wheezes. She has no rhonchi. She has no rales.  Abdominal: Soft. Normal appearance and bowel sounds are normal. She exhibits no distension. There is no tenderness. There is no rebound and no CVA tenderness.  Musculoskeletal: Normal range of motion. She exhibits no edema or tenderness.  Neurological: She is alert and oriented to person, place, and time. She has normal strength. No cranial nerve deficit or sensory deficit. GCS eye subscore is 4. GCS  verbal subscore is 5. GCS motor subscore is 6.  Skin: Skin is warm and dry. No abrasion and no rash noted.  Psychiatric: She has a normal mood and affect. Her speech is normal and behavior is normal.  Nursing note and vitals reviewed.    ED Treatments / Results  Labs (all labs ordered are listed, but only abnormal results are displayed) Labs Reviewed - No data to display  EKG  EKG Interpretation None       Radiology No results found.  Procedures Procedures (including critical care time)  Medications Ordered in ED Medications - No data to display   Initial Impression / Assessment and Plan / ED Course  I have reviewed the triage vital signs and the nursing notes.  Pertinent labs & imaging results that were available during my care of the patient were reviewed by me and considered in my medical decision making (see chart for details).     Earwax removed from both ears. Prescribed Cerumenex  Final Clinical Impressions(s) / ED Diagnoses   Final diagnoses:  None    New Prescriptions New Prescriptions   No medications on file     Lorre Nick, MD 11/18/16 1044

## 2016-11-18 NOTE — ED Triage Notes (Signed)
Per pt, states right and left ear discomfort-states was diagnosed with and ear infection on the right 1 month ago-couldn't afford drops so she used OTC drops-states now symptoms have moved onto the left ear

## 2017-02-13 ENCOUNTER — Encounter (HOSPITAL_COMMUNITY): Payer: Self-pay | Admitting: *Deleted

## 2017-02-13 ENCOUNTER — Inpatient Hospital Stay (HOSPITAL_COMMUNITY)
Admission: AD | Admit: 2017-02-13 | Discharge: 2017-02-13 | Disposition: A | Discharge status: Home / Self Care | Payer: BLUE CROSS/BLUE SHIELD | Source: Ambulatory Visit | Attending: Obstetrics & Gynecology | Admitting: Obstetrics & Gynecology

## 2017-02-13 DIAGNOSIS — N83209 Unspecified ovarian cyst, unspecified side: Secondary | ICD-10-CM | POA: Diagnosis not present

## 2017-02-13 DIAGNOSIS — G510 Bell's palsy: Secondary | ICD-10-CM | POA: Insufficient documentation

## 2017-02-13 DIAGNOSIS — B9689 Other specified bacterial agents as the cause of diseases classified elsewhere: Secondary | ICD-10-CM | POA: Diagnosis not present

## 2017-02-13 DIAGNOSIS — N760 Acute vaginitis: Secondary | ICD-10-CM | POA: Diagnosis not present

## 2017-02-13 DIAGNOSIS — E079 Disorder of thyroid, unspecified: Secondary | ICD-10-CM | POA: Diagnosis not present

## 2017-02-13 DIAGNOSIS — L298 Other pruritus: Secondary | ICD-10-CM | POA: Insufficient documentation

## 2017-02-13 DIAGNOSIS — K589 Irritable bowel syndrome without diarrhea: Secondary | ICD-10-CM | POA: Diagnosis not present

## 2017-02-13 DIAGNOSIS — Z113 Encounter for screening for infections with a predominantly sexual mode of transmission: Secondary | ICD-10-CM | POA: Insufficient documentation

## 2017-02-13 DIAGNOSIS — N3001 Acute cystitis with hematuria: Secondary | ICD-10-CM | POA: Diagnosis not present

## 2017-02-13 LAB — URINALYSIS, ROUTINE W REFLEX MICROSCOPIC
Bilirubin Urine: NEGATIVE
Glucose, UA: NEGATIVE mg/dL
KETONES UR: NEGATIVE mg/dL
Leukocytes, UA: NEGATIVE
Nitrite: NEGATIVE
PROTEIN: NEGATIVE mg/dL
Specific Gravity, Urine: 1.009 (ref 1.005–1.030)
pH: 6 (ref 5.0–8.0)

## 2017-02-13 LAB — WET PREP, GENITAL
Sperm: NONE SEEN
TRICH WET PREP: NONE SEEN
YEAST WET PREP: NONE SEEN

## 2017-02-13 MED ORDER — METRONIDAZOLE 500 MG PO TABS: 500.0000 mg | ORAL_TABLET | Freq: Two times a day (BID) | ORAL | 0 refills | Status: DC

## 2017-02-13 MED ORDER — SULFAMETHOXAZOLE-TRIMETHOPRIM 800-160 MG PO TABS: 1.0000 | ORAL_TABLET | Freq: Two times a day (BID) | ORAL | 0 refills | Status: DC

## 2017-02-13 NOTE — MAU Provider Note (Signed)
History     CSN: [id number]  Arrival date and time: 02/13/17 [id number]   First Provider Initiated Contact with Patient 02/13/17 1019      Chief Complaint  Patient presents with  . Vaginal Itching   HPI Robin Franco is a 51 y.o. non pregnant female who presents with vaginal irritation. Symptoms began over the weekend. Reports vaginal itching & irritation "like a yeast infection". Denies vaginal discharge. Also has noted a painful "lump" on her left labia that is uncomfortable when she walks. Denies lesions, vaginal bleeding, vaginal discharge, or dysuria. Is sexually active with 1 partner for 2 years. Does not use condoms.   Past Medical History:  Diagnosis Date  . Bell's palsy   . G-6-PD deficiency (HCC)   . IBS (irritable bowel syndrome)   . Ovarian cyst   . Thyroid disease    HYPER    Past Surgical History:  Procedure Laterality Date  . APPENDECTOMY    . CESAREAN SECTION    . FOOT SURGERY    . OVARIAN CYST REMOVAL    . TONSILLECTOMY    . TUBAL LIGATION      History reviewed. No pertinent family history.  Social History   Tobacco Use  . Smoking status: Never Smoker  . Smokeless tobacco: Never Used  Substance Use Topics  . Alcohol use: No  . Drug use: No    Allergies:  Allergies  Allergen Reactions  . Aspirin Nausea Only  . Caffeine Nausea Only    Medications Prior to Admission  Medication Sig Dispense Refill Last Dose  . ofloxacin (FLOXIN) 0.3 % OTIC solution Place 5 drops into the right ear 2 (two) times daily 3-5 days. (Patient not taking: Reported on 11/18/2016) 5 mL 0 Not Taking at Unknown time    Review of Systems  Constitutional: Negative.   Gastrointestinal: Negative.   Genitourinary: Positive for genital sores and vaginal pain. Negative for dyspareunia, dysuria, vaginal bleeding and vaginal discharge.       + vaginal itching   Physical Exam   Blood pressure 136/64, pulse 69, temperature 98 F (36.7 C), temperature source Oral, resp. rate 18,  last menstrual period 02/09/2017, SpO2 98 %.  Physical Exam  Nursing note and vitals reviewed. Constitutional: She is oriented to person, place, and time. She appears well-developed and well-nourished. No distress.  HENT:  Head: Normocephalic and atraumatic.  Eyes: Conjunctivae are normal. Right eye exhibits no discharge. Left eye exhibits no discharge. No scleral icterus.  Neck: Normal range of motion.  Respiratory: Effort normal. No respiratory distress.  GI: Soft. There is no tenderness.  Genitourinary: There is no lesion on the right labia. There is no lesion on the left labia. Cervix exhibits no motion tenderness and no friability. No bleeding in the vagina. Vaginal discharge (small amount of thin grey foul smelling discharge) found.  Neurological: She is alert and oriented to person, place, and time.  Skin: Skin is warm and dry. She is not diaphoretic.  Psychiatric: She has a normal mood and affect. Her behavior is normal. Judgment and thought content normal.    MAU Course  Procedures Results for orders placed or performed during the hospital encounter of 02/13/17 (from the past 24 hour(s))  Urinalysis, Routine w reflex microscopic     Status: Abnormal   Collection Time: 02/13/17  9:50 AM  Result Value Ref Range   Color, Urine YELLOW YELLOW   APPearance CLEAR CLEAR   Specific Gravity, Urine 1.009 1.005 - 1.030   pH  6.0 5.0 - 8.0   Glucose, UA NEGATIVE NEGATIVE mg/dL   Hgb urine dipstick SMALL (A) NEGATIVE   Bilirubin Urine NEGATIVE NEGATIVE   Ketones, ur NEGATIVE NEGATIVE mg/dL   Protein, ur NEGATIVE NEGATIVE mg/dL   Nitrite NEGATIVE NEGATIVE   Leukocytes, UA NEGATIVE NEGATIVE   RBC / HPF 0-5 0 - 5 RBC/hpf   WBC, UA 0-5 0 - 5 WBC/hpf   Bacteria, UA RARE (A) NONE SEEN   Squamous Epithelial / LPF 0-5 (A) NONE SEEN   Mucus PRESENT   Wet prep, genital     Status: Abnormal   Collection Time: 02/13/17 10:28 AM  Result Value Ref Range   Yeast Wet Prep HPF POC NONE SEEN NONE  SEEN   Trich, Wet Prep NONE SEEN NONE SEEN   Clue Cells Wet Prep HPF POC PRESENT (A) NONE SEEN   WBC, Wet Prep HPF POC FEW (A) NONE SEEN   Sperm NONE SEEN     MDM U/a suspicious for UTI & pt with complaint of increased urinary frequency -- pt opts for tx GC/CT & wet prep collected. Pt declines blood draw.  Assessment and Plan  A; 1. BV (bacterial vaginosis)   2. Screen for STD (sexually transmitted disease)   3. Acute cystitis with hematuria    P: Discharge home Rx flagyl & septra Discussed reasons to return to MAU GC/CT pending  Robin Franco 02/13/2017, 10:19 AM

## 2017-02-13 NOTE — MAU Note (Signed)
Patient c/o vaginal discomfort.  +vaginal itching Denies vaginal discharge or odor  Also reports a possible boil like spot on groin; stings when she walks

## 2017-02-13 NOTE — Discharge Instructions (Signed)
In late 2019, the [facility] will be moving to the [place] [facility]. At that time, the MAU (Maternity Admissions Unit), where you are being seen today, will no longer take care of non-pregnant patients. We strongly encourage you to find a doctor's office before that time, so that you can be seen with any GYN concerns, like vaginal discharge, urinary tract infection, etc.. in a timely manner. ° °In order to make an office visit more convenient, the [facility] will be offering evening hours with same-day appointments, walk-in appointments and scheduled appointments available during this time. ° °[facility] Hours: °Monday - 8am - 7:30 pm with walk-in between 4pm- 7:30 pm °Tuesday - 8 am - 5 pm (starting 05/16/17 we will be open late and accepting walk-ins from 4pm - 7:30pm) °Wednesday - 8 am - 5 pm (starting 08/16/17 we will be open late and accepting walk-ins from 4pm - 7:30pm) °Thursday 8 am - 5 pm (starting 11/16/17 we will be open late and accepting walk-ins from 4pm - 7:30pm) °Friday 8 am - 5 pm ° °For an appointment please call the [facility] at [phone] ° °For urgent needs, [facility] is also available for management of urgent GYN complaints such as vaginal discharge or urinary tract infections. ° ° ° ° ° ° °Bacterial Vaginosis °Bacterial vaginosis is a vaginal infection that occurs when the normal balance of bacteria in the vagina is disrupted. It results from an overgrowth of certain bacteria. This is the most common vaginal infection among women ages 15-44. °Because bacterial vaginosis increases your risk for STIs (sexually transmitted infections), getting treated can help reduce your risk for chlamydia, gonorrhea, herpes, and HIV (human immunodeficiency virus). Treatment is also important for preventing complications in pregnant women, because this condition can cause an early  (premature) delivery. °What are the causes? °This condition is caused by an increase in harmful bacteria that are normally present in small amounts in the vagina. However, the reason that the condition develops is not fully understood. °What increases the risk? °The following factors may make you more likely to develop this condition: °· Having a new sexual partner or multiple sexual partners. °· Having unprotected sex. °· Douching. °· Having an intrauterine device (IUD). °· Smoking. °· Drug and alcohol abuse. °· Taking certain antibiotic medicines. °· Being pregnant. ° °You cannot get bacterial vaginosis from toilet seats, bedding, swimming pools, or contact with objects around you. °What are the signs or symptoms? °Symptoms of this condition include: °· Grey or white vaginal discharge. The discharge can also be watery or foamy. °· A fish-like odor with discharge, especially after sexual intercourse or during menstruation. °· Itching in and around the vagina. °· Burning or pain with urination. ° °Some women with bacterial vaginosis have no signs or symptoms. °How is this diagnosed? °This condition is diagnosed based on: °· Your medical history. °· A physical exam of the vagina. °· Testing a sample of vaginal fluid under a microscope to look for a large amount of bad bacteria or abnormal cells. Your health care provider may use a cotton swab or a small wooden spatula to collect the sample. ° °How is this treated? °This condition is treated with antibiotics. These may be given as a pill, a vaginal cream, or a medicine that is put into the vagina (suppository). If the condition comes back after treatment, a second round of antibiotics may   be needed. °Follow these instructions at home: °Medicines °· Take over-the-counter and prescription medicines only as told by your health care provider. °· Take or use your antibiotic as told by your health care provider. Do not stop taking or using the antibiotic even if you start  to feel better. °General instructions °· If you have a female sexual partner, tell her that you have a vaginal infection. She should see her health care provider and be treated if she has symptoms. If you have a female sexual partner, he does not need treatment. °· During treatment: °? Avoid sexual activity until you finish treatment. °? Do not douche. °? Avoid alcohol as directed by your health care provider. °? Avoid breastfeeding as directed by your health care provider. °· Drink enough water and fluids to keep your urine clear or pale yellow. °· Keep the area around your vagina and rectum clean. °? Wash the area daily with warm water. °? Wipe yourself from front to back after using the toilet. °· Keep all follow-up visits as told by your health care provider. This is important. °How is this prevented? °· Do not douche. °· Wash the outside of your vagina with warm water only. °· Use protection when having sex. This includes latex condoms and dental dams. °· Limit how many sexual partners you have. To help prevent bacterial vaginosis, it is best to have sex with just one partner (monogamous). °· Make sure you and your sexual partner are tested for STIs. °· Wear cotton or cotton-lined underwear. °· Avoid wearing tight pants and pantyhose, especially during summer. °· Limit the amount of alcohol that you drink. °· Do not use any products that contain nicotine or tobacco, such as cigarettes and e-cigarettes. If you need help quitting, ask your health care provider. °· Do not use illegal drugs. °Where to find more information: °· Centers for Disease Control and Prevention: www.cdc.gov/std °· American Sexual Health Association (ASHA): www.ashastd.org °· U.S. Department of Health and Human Services, Office on Women's Health: www.womenshealth.gov/ or https://www.womenshealth.gov/a-z-topics/bacterial-vaginosis °Contact a health care provider if: °· Your symptoms do not improve, even after treatment. °· You have more  discharge or pain when urinating. °· You have a fever. °· You have pain in your abdomen. °· You have pain during sex. °· You have vaginal bleeding between periods. °Summary °· Bacterial vaginosis is a vaginal infection that occurs when the normal balance of bacteria in the vagina is disrupted. °· Because bacterial vaginosis increases your risk for STIs (sexually transmitted infections), getting treated can help reduce your risk for chlamydia, gonorrhea, herpes, and HIV (human immunodeficiency virus). Treatment is also important for preventing complications in pregnant women, because the condition can cause an early (premature) delivery. °· This condition is treated with antibiotic medicines. These may be given as a pill, a vaginal cream, or a medicine that is put into the vagina (suppository). °This information is not intended to replace advice given to you by your health care provider. Make sure you discuss any questions you have with your health care provider. °Document Released: 01/31/2005 Document Revised: 06/06/2016 Document Reviewed: 10/17/2015 °Elsevier Interactive Patient Education © 2018 [employer] ° °Urinary Tract Infection, Adult °A urinary tract infection (UTI) is an infection of any part of the urinary tract, which includes the kidneys, ureters, bladder, and urethra. These organs make, store, and get rid of urine in the body. UTI can be a bladder infection (cystitis) or kidney infection (pyelonephritis). °What are the causes? °This infection may be caused   by fungi, viruses, or bacteria. Bacteria are the most common cause of UTIs. This condition can also be caused by repeated incomplete emptying of the bladder during urination. °What increases the risk? °This condition is more likely to develop if: °· You ignore your need to urinate or hold urine for long periods of time. °· You do not empty your bladder completely during urination. °· You wipe back to front after urinating or having a bowel movement,  if you are female. °· You are uncircumcised, if you are female. °· You are constipated. °· You have a urinary catheter that stays in place (indwelling). °· You have a weak defense (immune) system. °· You have a medical condition that affects your bowels, kidneys, or bladder. °· You have diabetes. °· You take antibiotic medicines frequently or for long periods of time, and the antibiotics no longer work well against certain types of infections (antibiotic resistance). °· You take medicines that irritate your urinary tract. °· You are exposed to chemicals that irritate your urinary tract. °· You are female. ° °What are the signs or symptoms? °Symptoms of this condition include: °· Fever. °· Frequent urination or passing small amounts of urine frequently. °· Needing to urinate urgently. °· Pain or burning with urination. °· Urine that smells bad or unusual. °· Cloudy urine. °· Pain in the lower abdomen or back. °· Trouble urinating. °· Blood in the urine. °· Vomiting or being less hungry than normal. °· Diarrhea or abdominal pain. °· Vaginal discharge, if you are female. ° °How is this diagnosed? °This condition is diagnosed with a medical history and physical exam. You will also need to provide a urine sample to test your urine. Other tests may be done, including: °· Blood tests. °· Sexually transmitted disease (STD) testing. ° °If you have had more than one UTI, a cystoscopy or imaging studies may be done to determine the cause of the infections. °How is this treated? °Treatment for this condition often includes a combination of two or more of the following: °· Antibiotic medicine. °· Other medicines to treat less common causes of UTI. °· Over-the-counter medicines to treat pain. °· Drinking enough water to stay hydrated. ° °Follow these instructions at home: °· Take over-the-counter and prescription medicines only as told by your health care provider. °· If you were prescribed an antibiotic, take it as told by your  health care provider. Do not stop taking the antibiotic even if you start to feel better. °· Avoid alcohol, caffeine, tea, and carbonated beverages. They can irritate your bladder. °· Drink enough fluid to keep your urine clear or pale yellow. °· Keep all follow-up visits as told by your health care provider. This is important. °· Make sure to: °? Empty your bladder often and completely. Do not hold urine for long periods of time. °? Empty your bladder before and after sex. °? Wipe from front to back after a bowel movement if you are female. Use each tissue one time when you wipe. °Contact a health care provider if: °· You have back pain. °· You have a fever. °· You feel nauseous or vomit. °· Your symptoms do not get better after 3 days. °· Your symptoms go away and then return. °Get help right away if: °· You have severe back pain or lower abdominal pain. °· You are vomiting and cannot keep down any medicines or water. °This information is not intended to replace advice given to you by your health care provider. Make sure   you discuss any questions you have with your health care provider. °Document Released: 11/10/2004 Document Revised: 07/15/2015 Document Reviewed: 12/22/2014 °Elsevier Interactive Patient Education © 2018 Elsevier Inc. ° °

## 2017-02-15 LAB — GC/CHLAMYDIA PROBE AMP (~~LOC~~) NOT AT ARMC
CHLAMYDIA, DNA PROBE: NEGATIVE
Neisseria Gonorrhea: NEGATIVE

## 2017-07-10 ENCOUNTER — Emergency Department (HOSPITAL_COMMUNITY)
Admission: EM | Admit: 2017-07-10 | Discharge: 2017-07-10 | Disposition: A | Discharge status: Home / Self Care | Payer: BLUE CROSS/BLUE SHIELD | Attending: Emergency Medicine | Admitting: Emergency Medicine

## 2017-07-10 ENCOUNTER — Emergency Department (HOSPITAL_COMMUNITY): Payer: BLUE CROSS/BLUE SHIELD

## 2017-07-10 ENCOUNTER — Encounter (HOSPITAL_COMMUNITY): Payer: Self-pay | Admitting: Emergency Medicine

## 2017-07-10 DIAGNOSIS — J209 Acute bronchitis, unspecified: Secondary | ICD-10-CM | POA: Insufficient documentation

## 2017-07-10 DIAGNOSIS — R05 Cough: Secondary | ICD-10-CM | POA: Diagnosis present

## 2017-07-10 MED ORDER — AZITHROMYCIN 250 MG PO TABS: 250.0000 mg | ORAL_TABLET | Freq: Every day | ORAL | 0 refills | Status: DC

## 2017-07-10 MED ORDER — PREDNISONE 50 MG PO TABS: ORAL_TABLET | ORAL | 0 refills | Status: DC

## 2017-07-10 MED ORDER — BENZONATATE 100 MG PO CAPS: 100.0000 mg | ORAL_CAPSULE | Freq: Three times a day (TID) | ORAL | 0 refills | Status: DC

## 2017-07-10 NOTE — ED Triage Notes (Addendum)
Patient here via EMS from work with complaints of allergic action. Itching and throat itching, possible mold in apartment.  Benadryl given.

## 2017-07-10 NOTE — Discharge Instructions (Addendum)
Take the medication and follow up with a primary care doctor or with the allergist. Return here as needed.

## 2017-07-10 NOTE — ED Provider Notes (Signed)
[facility]-EMERGENCY DEPT Provider Note   CSN: [id number] Arrival date & time: 07/10/17  1646     History   Chief Complaint Chief Complaint  Patient presents with  . Allergic Reaction    HPI Robin Franco is a 52 y.o. female who presents to the ED with a cough. Patient states the place she rents has what looks like mole on the walls and floors. The city came out and said it looked like mole. Patient is afraid she is getting an infection because she is coughing. She does not smoke. Patient states she does not have anywhere else to live until they get this matter straightened. She is waiting for "them" to pay for her to move.   HPI  Past Medical History:  Diagnosis Date  . Bell's palsy   . G-6-PD deficiency (HCC)   . IBS (irritable bowel syndrome)   . Ovarian cyst   . Thyroid disease    HYPER    There are no active problems to display for this patient.   Past Surgical History:  Procedure Laterality Date  . APPENDECTOMY    . CESAREAN SECTION    . FOOT SURGERY    . OVARIAN CYST REMOVAL    . TONSILLECTOMY    . TUBAL LIGATION       OB History    Gravida  2   Para  2   Term  2   Preterm  0   AB  0   Living  2     SAB  0   TAB  0   Ectopic  0   Multiple  0   Live Births               Home Medications    Prior to Admission medications   Medication Sig Start Date End Date Taking? Authorizing Provider  azithromycin (ZITHROMAX) 250 MG tablet Take 1 tablet (250 mg total) by mouth daily. Take first 2 tablets together, then 1 every day until finished. 07/10/17   Janne Napoleon, NP  benzonatate (TESSALON) 100 MG capsule Take 1 capsule (100 mg total) by mouth every 8 (eight) hours. 07/10/17   Janne Napoleon, NP  predniSONE (DELTASONE) 50 MG tablet Take one tablet PO daily 07/10/17   Janne Napoleon, NP    Family History No family history on file.  Social History Social History   Tobacco Use  . Smoking status: Never Smoker  .  Smokeless tobacco: Never Used  Substance Use Topics  . Alcohol use: No  . Drug use: No     Allergies   Aspirin and Caffeine   Review of Systems Review of Systems  Constitutional: Negative for chills and fever.  HENT: Negative for congestion, sore throat and trouble swallowing.   Eyes: Negative for redness and itching.  Respiratory: Positive for cough. Negative for shortness of breath.   Gastrointestinal: Negative for abdominal pain, nausea and vomiting.  Musculoskeletal: Negative for myalgias.  Skin: Negative for rash.  Neurological: Negative for syncope. Headaches: occasional.  Psychiatric/Behavioral: Negative for confusion.     Physical Exam Updated Vital Signs BP (!) 162/83 (BP Location: Right Arm) Comment: pt eager to go but states she will f/u w/PMD about BP  Pulse 60   Temp 98.3 F (36.8 C) (Oral)   Resp 16   SpO2 100%   Physical Exam  Constitutional: She appears well-developed and well-nourished. No distress.  HENT:  Head: Normocephalic.  Mouth/Throat: Oropharynx is clear and moist.  Eyes: EOM are normal.  Neck: Neck supple.  Cardiovascular: Normal rate and regular rhythm.  Pulmonary/Chest: Effort normal. She has no wheezes. She has no rales. She exhibits no tenderness.  Musculoskeletal: Normal range of motion.  Neurological: She is alert.  Skin: Skin is warm and dry.  Psychiatric: She has a normal mood and affect.  Nursing note and vitals reviewed.    ED Treatments / Results  Labs (all labs ordered are listed, but only abnormal results are displayed) Labs Reviewed - No data to display Radiology Dg Chest 2 View  Result Date: 07/10/2017 CLINICAL DATA:  Shortness of breath. Possible allergic reaction. Mold exposure. EXAM: CHEST - 2 VIEW COMPARISON:  04/24/2013. FINDINGS: Normal sized heart. Clear lungs. Mild central peribronchial thickening without significant change. Mild upper thoracic spine degenerative changes. IMPRESSION: Stable mild bronchitic  changes. Electronically Signed   By: Beckie Salts M.D.   On: 07/10/2017 19:19    Procedures Procedures (including critical care time)  Medications Ordered in ED Medications - No data to display   Initial Impression / Assessment and Plan / ED Course  I have reviewed the triage vital signs and the nursing notes. 52 y.o. female here with cough after being exposed to what she thinks is mole in her home. Patient is stable for d/c without fever and does not appear toxic. Doubt legionella but will treat with Z-pak for possible infection. Encouraged patient to move as soon as possible and wear a mask when in the house. F/u with allergist. Referral given. Patient appears safe for d/c without focal infiltrate on x-ray and 02 SAT 100% on R/A. Final Clinical Impressions(s) / ED Diagnoses   Final diagnoses:  Acute bronchitis, unspecified organism    ED Discharge Orders        Ordered    predniSONE (DELTASONE) 50 MG tablet     07/10/17 2018    benzonatate (TESSALON) 100 MG capsule  Every 8 hours     07/10/17 2018    azithromycin (ZITHROMAX) 250 MG tablet  Daily     07/10/17 2023       Kerrie Buffalo A[place], [place] 07/11/17 1608    Tegeler, Canary Brim, MD 07/12/17 [phone]

## 2017-09-07 DIAGNOSIS — H6123 Impacted cerumen, bilateral: Secondary | ICD-10-CM | POA: Insufficient documentation

## 2017-09-11 ENCOUNTER — Emergency Department (HOSPITAL_COMMUNITY)
Admission: EM | Admit: 2017-09-11 | Discharge: 2017-09-11 | Disposition: A | Discharge status: Home / Self Care | Payer: BLUE CROSS/BLUE SHIELD | Attending: Emergency Medicine | Admitting: Emergency Medicine

## 2017-09-11 ENCOUNTER — Other Ambulatory Visit: Payer: Self-pay

## 2017-09-11 ENCOUNTER — Encounter (HOSPITAL_COMMUNITY): Payer: Self-pay

## 2017-09-11 DIAGNOSIS — Z79899 Other long term (current) drug therapy: Secondary | ICD-10-CM | POA: Insufficient documentation

## 2017-09-11 DIAGNOSIS — T7840XA Allergy, unspecified, initial encounter: Secondary | ICD-10-CM | POA: Insufficient documentation

## 2017-09-11 MED ORDER — DIPHENHYDRAMINE HCL 25 MG PO TABS: 25.0000 mg | ORAL_TABLET | Freq: Three times a day (TID) | ORAL | 0 refills | Status: DC

## 2017-09-11 MED ORDER — PREDNISONE 20 MG PO TABS: 40.0000 mg | ORAL_TABLET | Freq: Every day | ORAL | 0 refills | Status: DC

## 2017-09-11 MED ORDER — PREDNISONE 20 MG PO TABS
60.0000 mg | ORAL_TABLET | ORAL | Status: AC
  Administered 2017-09-11: 60 mg via ORAL
  Filled 2017-09-11: qty 3

## 2017-09-11 MED ORDER — FAMOTIDINE 20 MG PO TABS: 20.0000 mg | ORAL_TABLET | Freq: Two times a day (BID) | ORAL | 0 refills | Status: DC

## 2017-09-11 MED ORDER — ALBUTEROL SULFATE HFA 108 (90 BASE) MCG/ACT IN AERS
2.0000 | INHALATION_SPRAY | Freq: Four times a day (QID) | RESPIRATORY_TRACT | Status: DC
  Administered 2017-09-11: 2 via RESPIRATORY_TRACT
  Filled 2017-09-11: qty 6.7

## 2017-09-11 NOTE — ED Triage Notes (Signed)
Pt arrives from home. Pt reports that on Friday she had allergy testing completed and since Friday she has "been getting worse and worse" Pt reports itchy water eyes, nose. Pt reports swelling to rt arm where shots where given. Pt reports her face feels more swollen. Pt reports that the symptoms began 6 hrs after the allergy testing and have not went away. NAD noted in triage.

## 2017-09-11 NOTE — ED Provider Notes (Signed)
[facility]-EMERGENCY DEPT Provider Note   CSN: [id number] Arrival date & time: 09/11/17  0801     History   Chief Complaint Chief Complaint  Patient presents with  . Allergic Reaction    HPI Robin Franco is a 52 y.o. female.  HPI Patient presents with concern of facial swelling, irritation in her arm and back, and difficulty breathing. Patient is currently being evaluated for allergies, had multiple injections performed 3 days ago. She notes that since that time she has had pain and swelling in her arm, back, and worsening facial fullness, but today developed additional breathing difficulty. She also notes some concern about her living situation, with specific concern for possible mold exposure. She denies other complaints, notes that she has not received her prescriptions for symptom medic relief prescribed by allergist, has been taking no new medication for relief. She notes that she has had prior similar episodes after allergen exposure. No focal pain, no syncope, no fever. No primary care.  Past Medical History:  Diagnosis Date  . Bell's palsy   . G-6-PD deficiency (HCC)   . IBS (irritable bowel syndrome)   . Ovarian cyst   . Thyroid disease    HYPER    There are no active problems to display for this patient.   Past Surgical History:  Procedure Laterality Date  . APPENDECTOMY    . CESAREAN SECTION    . FOOT SURGERY    . OVARIAN CYST REMOVAL    . TONSILLECTOMY    . TUBAL LIGATION       OB History    Gravida  2   Para  2   Term  2   Preterm  0   AB  0   Living  2     SAB  0   TAB  0   Ectopic  0   Multiple  0   Live Births               Home Medications    Prior to Admission medications   Medication Sig Start Date End Date Taking? Authorizing Provider  acetaminophen (TYLENOL) 325 MG tablet Take 650 mg by mouth every 6 (six) hours.   Yes [provider]  azithromycin (ZITHROMAX) 250 MG tablet Take 1  tablet (250 mg total) by mouth daily. Take first 2 tablets together, then 1 every day until finished. Patient not taking: Reported on 09/11/2017 07/10/17   Janne Napoleon, NP  benzonatate (TESSALON) 100 MG capsule Take 1 capsule (100 mg total) by mouth every 8 (eight) hours. Patient not taking: Reported on 09/11/2017 07/10/17   Janne Napoleon, NP  diphenhydrAMINE (BENADRYL) 25 MG tablet Take 1 tablet (25 mg total) by mouth 3 (three) times daily. Take one tablet three times daily for two days 09/11/17   Gerhard Munch, MD  famotidine (PEPCID) 20 MG tablet Take 1 tablet (20 mg total) by mouth 2 (two) times daily. Take one tablet twice daily for two days 09/11/17   Gerhard Munch, MD  predniSONE (DELTASONE) 20 MG tablet Take 2 tablets (40 mg total) by mouth daily with breakfast. For the next four days 09/11/17   Gerhard Munch, MD    Family History History reviewed. No pertinent family history.  Social History Social History   Tobacco Use  . Smoking status: Never Smoker  . Smokeless tobacco: Never Used  Substance Use Topics  . Alcohol use: Yes    Comment: occ  . Drug use: No  Allergies   Aspirin; Caffeine; and Sulfa antibiotics   Review of Systems Review of Systems  Constitutional:       Per HPI, otherwise negative  HENT:       Per HPI, otherwise negative  Respiratory:       Per HPI, otherwise negative  Cardiovascular:       Per HPI, otherwise negative  Gastrointestinal: Negative for vomiting.  Endocrine:       Patient has been informed of possible Graves' disease, has not seen primary care yet  Genitourinary:       Neg aside from HPI   Musculoskeletal:       Per HPI, otherwise negative  Skin: Negative.   Allergic/Immunologic:       Per HPI, under evaluation  Neurological: Negative for syncope.     Physical Exam Updated Vital Signs BP (!) 149/87   Pulse 68   Temp 98.3 F (36.8 C) (Oral)   Resp 15   Wt 61.2 kg (135 lb)   SpO2 100%   BMI 22.47 kg/m    Physical Exam  Constitutional: She is oriented to person, place, and time. She appears well-developed and well-nourished. No distress.  HENT:  Head: Normocephalic and atraumatic.  Mouth/Throat: Uvula is midline, oropharynx is clear and moist and mucous membranes are normal.  Minimal facial fullness appreciable throughout  Eyes: Conjunctivae and EOM are normal.  Cardiovascular: Normal rate and regular rhythm.  Pulmonary/Chest: No stridor. No respiratory distress. She has decreased breath sounds.  Abdominal: She exhibits no distension.  Musculoskeletal: She exhibits no edema.  Neurological: She is alert and oriented to person, place, and time. No cranial nerve deficit.  Skin: Skin is warm and dry.     Psychiatric: She has a normal mood and affect.  Nursing note and vitals reviewed.    ED Treatments / Results   EKG EKG Interpretation  Date/Time:  Monday September 11 2017 08:07:08 EDT Ventricular Rate:  69 PR Interval:    QRS Duration: 127 QT Interval:  401 QTC Calculation: 430 R Axis:   44 Text Interpretation:  Sinus rhythm Consider left atrial enlargement Nonspecific intraventricular conduction delay Minimal ST elevated seen in EKG from 2000.  No STEMI.   Abnormal EKG  Procedures Procedures (including critical care time)  Medications Ordered in ED Medications  predniSONE (DELTASONE) tablet 60 mg (has no administration in time range)  albuterol (PROVENTIL HFA;VENTOLIN HFA) 108 (90 Base) MCG/ACT inhaler 2 puff (has no administration in time range)     Initial Impression / Assessment and Plan / ED Course  I have reviewed the triage vital signs and the nursing notes.  Pertinent labs & imaging results that were available during my care of the patient were reviewed by me and considered in my medical decision making (see chart for details).    Well-appearing female presents with concern for possible allergic reaction. Patient does have some facial fullness, given her  descriptive difficulty breathing, diminished breath sounds, will start on albuterol as well as prednisone, antihistamines No posterior oropharyngeal swelling, no asymmetry, no evidence for anaphylaxis, bacteremia, sepsis. Patient encouraged to establish care with primary care, started on medication as above, discharged in stable condition.  Final Clinical Impressions(s) / ED Diagnoses   Final diagnoses:  Allergic reaction, initial encounter    ED Discharge Orders        Ordered    famotidine (PEPCID) 20 MG tablet  2 times daily     09/11/17 0831    diphenhydrAMINE (BENADRYL) 25  MG tablet  3 times daily     09/11/17 0831    predniSONE (DELTASONE) 20 MG tablet  Daily with breakfast     09/11/17 0831       Gerhard Munch, MD 09/11/17 [phone]

## 2017-09-11 NOTE — Discharge Instructions (Addendum)
As discussed, your evaluation today has been largely reassuring.  But, it is important that you monitor your condition carefully, and do not hesitate to return to the ED if you develop new, or concerning changes in your condition.  Please use the provided inhaler as needed, and obtaining the prescriptions which have been transmitted to your pharmacy, use them as directed.  Please use the provided referral to our affiliated health and wellness center to establish primary care.

## 2017-09-17 ENCOUNTER — Inpatient Hospital Stay (HOSPITAL_COMMUNITY)
Admission: AD | Admit: 2017-09-17 | Discharge: 2017-09-17 | Discharge status: Absent without leave | Payer: BLUE CROSS/BLUE SHIELD | Source: Ambulatory Visit | Attending: Obstetrics & Gynecology | Admitting: Obstetrics & Gynecology

## 2018-02-14 ENCOUNTER — Other Ambulatory Visit: Payer: Self-pay

## 2018-02-14 ENCOUNTER — Emergency Department (HOSPITAL_COMMUNITY)
Admission: EM | Admit: 2018-02-14 | Discharge: 2018-02-14 | Discharge status: Against Medical Advice | Payer: Commercial Managed Care - PPO | Attending: Emergency Medicine | Admitting: Emergency Medicine

## 2018-02-14 ENCOUNTER — Encounter (HOSPITAL_COMMUNITY): Payer: Self-pay | Admitting: *Deleted

## 2018-02-14 ENCOUNTER — Emergency Department (HOSPITAL_COMMUNITY): Payer: Commercial Managed Care - PPO

## 2018-02-14 DIAGNOSIS — Z5321 Procedure and treatment not carried out due to patient leaving prior to being seen by health care provider: Secondary | ICD-10-CM | POA: Diagnosis not present

## 2018-02-14 DIAGNOSIS — M62830 Muscle spasm of back: Secondary | ICD-10-CM | POA: Insufficient documentation

## 2018-02-14 DIAGNOSIS — R079 Chest pain, unspecified: Secondary | ICD-10-CM | POA: Diagnosis not present

## 2018-02-14 DIAGNOSIS — M5489 Other dorsalgia: Secondary | ICD-10-CM | POA: Diagnosis not present

## 2018-02-14 DIAGNOSIS — R0789 Other chest pain: Secondary | ICD-10-CM | POA: Diagnosis not present

## 2018-02-14 LAB — I-STAT BETA HCG BLOOD, ED (NOT ORDERABLE)

## 2018-02-14 LAB — CBC
HCT: 35.5 % — ABNORMAL LOW (ref 36.0–46.0)
HEMOGLOBIN: 10.6 g/dL — AB (ref 12.0–15.0)
MCH: 24.9 pg — ABNORMAL LOW (ref 26.0–34.0)
MCHC: 29.9 g/dL — AB (ref 30.0–36.0)
MCV: 83.5 fL (ref 80.0–100.0)
NRBC: 0 % (ref 0.0–0.2)
Platelets: 303 10*3/uL (ref 150–400)
RBC: 4.25 MIL/uL (ref 3.87–5.11)
RDW: 15 % (ref 11.5–15.5)
WBC: 6.6 10*3/uL (ref 4.0–10.5)

## 2018-02-14 LAB — BASIC METABOLIC PANEL
Anion gap: 5 (ref 5–15)
BUN: 18 mg/dL (ref 6–20)
CHLORIDE: 108 mmol/L (ref 98–111)
CO2: 25 mmol/L (ref 22–32)
Calcium: 9.1 mg/dL (ref 8.9–10.3)
Creatinine, Ser: 0.84 mg/dL (ref 0.44–1.00)
GFR calc Af Amer: >60 mL/min (ref >60)
GFR calc non Af Amer: >60 mL/min (ref >60)
Glucose, Bld: 88 mg/dL (ref 70–99)
POTASSIUM: 3.9 mmol/L (ref 3.5–5.1)
SODIUM: 138 mmol/L (ref 135–145)

## 2018-02-14 LAB — POCT I-STAT TROPONIN I: Troponin i, poc: 0 ng/mL (ref 0.00–0.08)

## 2018-02-14 NOTE — ED Triage Notes (Addendum)
EMS reports 2 weeks of chest pain burning sensation relieved with belching, today back spasms. #20 LAC with 4mg  Zofran. 144/85-63-20-100% RA CBG 90 would like to be evaluated for Graves Disease, became short with Clinical research associate when inquiring about following up since symptoms for that have been going on for about a year.

## 2018-02-14 NOTE — ED Notes (Signed)
Pt request IV removed and is leaving; pt informed risks of leaving without being seen by a medical provider; pt confirms understanding.

## 2018-02-16 ENCOUNTER — Other Ambulatory Visit: Payer: Self-pay

## 2018-02-16 ENCOUNTER — Emergency Department (HOSPITAL_COMMUNITY)
Admission: EM | Admit: 2018-02-16 | Discharge: 2018-02-16 | Disposition: A | Discharge status: Home / Self Care | Payer: Commercial Managed Care - PPO | Attending: Emergency Medicine | Admitting: Emergency Medicine

## 2018-02-16 ENCOUNTER — Telehealth: Payer: Self-pay | Admitting: Endocrinology

## 2018-02-16 ENCOUNTER — Encounter (HOSPITAL_COMMUNITY): Payer: Self-pay

## 2018-02-16 DIAGNOSIS — K219 Gastro-esophageal reflux disease without esophagitis: Secondary | ICD-10-CM | POA: Diagnosis not present

## 2018-02-16 DIAGNOSIS — Z79899 Other long term (current) drug therapy: Secondary | ICD-10-CM | POA: Diagnosis not present

## 2018-02-16 DIAGNOSIS — R946 Abnormal results of thyroid function studies: Secondary | ICD-10-CM | POA: Diagnosis not present

## 2018-02-16 DIAGNOSIS — R079 Chest pain, unspecified: Secondary | ICD-10-CM | POA: Diagnosis not present

## 2018-02-16 DIAGNOSIS — R7989 Other specified abnormal findings of blood chemistry: Secondary | ICD-10-CM | POA: Diagnosis present

## 2018-02-16 LAB — TSH: TSH: 7.134 u[IU]/mL — ABNORMAL HIGH (ref 0.350–4.500)

## 2018-02-16 LAB — COMPREHENSIVE METABOLIC PANEL
ALK PHOS: 91 U/L (ref 38–126)
ALT: 18 U/L (ref 0–44)
ANION GAP: 9 (ref 5–15)
AST: 22 U/L (ref 15–41)
Albumin: 4.9 g/dL (ref 3.5–5.0)
BILIRUBIN TOTAL: 1.1 mg/dL (ref 0.3–1.2)
BUN: 13 mg/dL (ref 6–20)
CALCIUM: 9.8 mg/dL (ref 8.9–10.3)
CO2: 26 mmol/L (ref 22–32)
CREATININE: 0.76 mg/dL (ref 0.44–1.00)
Chloride: 104 mmol/L (ref 98–111)
Glucose, Bld: 91 mg/dL (ref 70–99)
Potassium: 4.2 mmol/L (ref 3.5–5.1)
Sodium: 139 mmol/L (ref 135–145)
TOTAL PROTEIN: 8.5 g/dL — AB (ref 6.5–8.1)

## 2018-02-16 LAB — CBC WITH DIFFERENTIAL/PLATELET
ABS IMMATURE GRANULOCYTES: 0.01 10*3/uL (ref 0.00–0.07)
Basophils Absolute: 0 10*3/uL (ref 0.0–0.1)
Basophils Relative: 1 %
EOS PCT: 3 %
Eosinophils Absolute: 0.2 10*3/uL (ref 0.0–0.5)
HEMATOCRIT: 40.9 % (ref 36.0–46.0)
HEMOGLOBIN: 12.3 g/dL (ref 12.0–15.0)
Immature Granulocytes: 0 %
LYMPHS PCT: 34 %
Lymphs Abs: 2.1 10*3/uL (ref 0.7–4.0)
MCH: 25.3 pg — AB (ref 26.0–34.0)
MCHC: 30.1 g/dL (ref 30.0–36.0)
MCV: 84.2 fL (ref 80.0–100.0)
MONO ABS: 0.5 10*3/uL (ref 0.1–1.0)
MONOS PCT: 9 %
NEUTROS ABS: 3.3 10*3/uL (ref 1.7–7.7)
Neutrophils Relative %: 53 %
Platelets: 304 10*3/uL (ref 150–400)
RBC: 4.86 MIL/uL (ref 3.87–5.11)
RDW: 15 % (ref 11.5–15.5)
WBC: 6.1 10*3/uL (ref 4.0–10.5)
nRBC: 0 % (ref 0.0–0.2)

## 2018-02-16 MED ORDER — LEVOTHYROXINE SODIUM 75 MCG PO TABS: 75.0000 ug | ORAL_TABLET | Freq: Every day | ORAL | 1 refills | Status: DC

## 2018-02-16 MED ORDER — PANTOPRAZOLE SODIUM 20 MG PO TBEC: 20.0000 mg | DELAYED_RELEASE_TABLET | Freq: Every day | ORAL | 0 refills | Status: DC

## 2018-02-16 NOTE — ED Triage Notes (Addendum)
Patient states she is having "bad heartburn" of the upper abdomen and was seen 2-3 days ago for the same. Patient states she had to leave before being seen.. Patient also c/o "black spots" on her back.

## 2018-02-16 NOTE — Discharge Instructions (Signed)
Your TSH is elevated.  Will recommend a starting dose of thyroid medicine.  Also medication for your GERD.  You will need primary care follow-up.

## 2018-02-16 NOTE — Telephone Encounter (Signed)
Patient called stating she is wanting to make an appointment with Dr. Everardo All for her grave's disease. I stated to patient the process for becoming a new patient. Patient accepted but will be going to the urgent care for a referral.

## 2018-02-16 NOTE — ED Notes (Signed)
ED Provider at bedside. 

## 2018-02-18 NOTE — ED Provider Notes (Signed)
[facility]-EMERGENCY DEPT Provider Note   CSN: [id number] Arrival date & time: 02/16/18  [id number]     History   Chief Complaint Chief Complaint  Patient presents with  . Heartburn  . spots on back    HPI Robin Franco is a 53 y.o. female.  Patient presents with a concern about "heartburn" described as a burning feeling in her esophagus.  She also describes a pain that initiates in left chest and migrates to the left abdomen and then to the right abdomen.  She is concerned about the possibility of thyroid disease.  Review of systems positive for weight loss and a black spot on her back..  No deep substernal chest pain, dyspnea, diaphoresis.     Past Medical History:  Diagnosis Date  . Bell's palsy   . G-6-PD deficiency   . IBS (irritable bowel syndrome)   . Ovarian cyst   . Thyroid disease    HYPER    There are no active problems to display for this patient.   Past Surgical History:  Procedure Laterality Date  . APPENDECTOMY    . CESAREAN SECTION    . FOOT SURGERY    . OVARIAN CYST REMOVAL    . TONSILLECTOMY    . TUBAL LIGATION       OB History    Gravida  2   Para  2   Term  2   Preterm  0   AB  0   Living  2     SAB  0   TAB  0   Ectopic  0   Multiple  0   Live Births               Home Medications    Prior to Admission medications   Medication Sig Start Date End Date Taking? Authorizing Provider  acetaminophen (TYLENOL) 325 MG tablet Take 650 mg by mouth every 6 (six) hours.   Yes [provider]  azithromycin (ZITHROMAX) 250 MG tablet Take 1 tablet (250 mg total) by mouth daily. Take first 2 tablets together, then 1 every day until finished. Patient not taking: Reported on 09/11/2017 07/10/17   Janne NapoleonNeese, Hope M, NP  benzonatate (TESSALON) 100 MG capsule Take 1 capsule (100 mg total) by mouth every 8 (eight) hours. Patient not taking: Reported on 09/11/2017 07/10/17   Janne NapoleonNeese, Hope M, NP  diphenhydrAMINE  (BENADRYL) 25 MG tablet Take 1 tablet (25 mg total) by mouth 3 (three) times daily. Take one tablet three times daily for two days Patient not taking: Reported on 02/16/2018 09/11/17   Gerhard MunchLockwood, Robert, MD  famotidine (PEPCID) 20 MG tablet Take 1 tablet (20 mg total) by mouth 2 (two) times daily. Take one tablet twice daily for two days Patient not taking: Reported on 02/16/2018 09/11/17   Gerhard MunchLockwood, Robert, MD  levothyroxine (SYNTHROID) 75 MCG tablet Take 1 tablet (75 mcg total) by mouth daily before breakfast. 02/16/18   Donnetta Hutchingook, Harly Pipkins, MD  pantoprazole (PROTONIX) 20 MG tablet Take 1 tablet (20 mg total) by mouth daily. 02/16/18   Donnetta Hutchingook, Azaliah Carrero, MD  predniSONE (DELTASONE) 20 MG tablet Take 2 tablets (40 mg total) by mouth daily with breakfast. For the next four days Patient not taking: Reported on 02/16/2018 09/11/17   Gerhard MunchLockwood, Robert, MD    Family History History reviewed. No pertinent family history.  Social History Social History   Tobacco Use  . Smoking status: Never Smoker  . Smokeless tobacco: Never Used  Substance Use  Topics  . Alcohol use: Yes    Comment: occ  . Drug use: No     Allergies   Aspirin; Caffeine; and Sulfa antibiotics   Review of Systems Review of Systems  All other systems reviewed and are negative.    Physical Exam Updated Vital Signs BP 123/84   Pulse 60   Temp 98.7 F (37.1 C) (Oral)   Resp 16   Ht 5\' 4"  (1.626 m)   Wt 61.2 kg   LMP 02/04/2018   SpO2 100%   BMI 23.17 kg/m   Physical Exam Vitals signs and nursing note reviewed.  Constitutional:      Appearance: She is well-developed.  HENT:     Head: Normocephalic and atraumatic.  Eyes:     Conjunctiva/sclera: Conjunctivae normal.  Neck:     Musculoskeletal: Neck supple.  Cardiovascular:     Rate and Rhythm: Normal rate and regular rhythm.  Pulmonary:     Effort: Pulmonary effort is normal.     Breath sounds: Normal breath sounds.  Abdominal:     General: Bowel sounds are normal.      Palpations: Abdomen is soft.  Musculoskeletal: Normal range of motion.  Skin:    Comments: Left mid back: Macular light tan area approximately 4 cm in diameter  Neurological:     Mental Status: She is alert and oriented to person, place, and time.  Psychiatric:        Behavior: Behavior normal.      ED Treatments / Results  Labs (all labs ordered are listed, but only abnormal results are displayed) Labs Reviewed  TSH - Abnormal; Notable for the following components:      Result Value   TSH 7.134 (*)    All other components within normal limits  CBC WITH DIFFERENTIAL/PLATELET - Abnormal; Notable for the following components:   MCH 25.3 (*)    All other components within normal limits  COMPREHENSIVE METABOLIC PANEL - Abnormal; Notable for the following components:   Total Protein 8.5 (*)    All other components within normal limits    EKG EKG Interpretation  Date/Time:  Friday February 16 2018 09:42:34 EST Ventricular Rate:  58 PR Interval:    QRS Duration: 118 QT Interval:  438 QTC Calculation: 431 R Axis:   36 Text Interpretation:  Sinus rhythm Nonspecific intraventricular conduction delay Confirmed by Lockie Mola, Adam ([id number]) on 02/17/2018 2:00:04 PM   Radiology No results found.  Procedures Procedures (including critical care time)  Medications Ordered in ED Medications - No data to display   Initial Impression / Assessment and Plan / ED Course  I have reviewed the triage vital signs and the nursing notes.  Pertinent labs & imaging results that were available during my care of the patient were reviewed by me and considered in my medical decision making (see chart for details).     Patient presents with concern about GERD and thyroid disease.  TSH elevated.  Will start low-dose Synthroid.  Strongly encouraged primary care follow-up.  Charge medications Synthroid 75 mcg and Protonix 20 mg.  Final Clinical Impressions(s) / ED Diagnoses   Final diagnoses:    Elevated TSH  Gastroesophageal reflux disease, esophagitis presence not specified    ED Discharge Orders         Ordered    levothyroxine (SYNTHROID) 75 MCG tablet  Daily before breakfast     02/16/18 1314    pantoprazole (PROTONIX) 20 MG tablet  Daily     02/16/18  1314           Donnetta Hutching, MD 02/18/18 2359

## 2018-02-22 ENCOUNTER — Ambulatory Visit: Payer: Self-pay | Admitting: Nurse Practitioner

## 2018-02-22 DIAGNOSIS — Z0289 Encounter for other administrative examinations: Secondary | ICD-10-CM

## 2018-03-15 ENCOUNTER — Encounter: Payer: Self-pay | Admitting: Nurse Practitioner

## 2018-05-01 DIAGNOSIS — R030 Elevated blood-pressure reading, without diagnosis of hypertension: Secondary | ICD-10-CM | POA: Diagnosis not present

## 2018-05-01 DIAGNOSIS — J069 Acute upper respiratory infection, unspecified: Secondary | ICD-10-CM | POA: Diagnosis not present

## 2018-05-01 DIAGNOSIS — R05 Cough: Secondary | ICD-10-CM | POA: Diagnosis not present

## 2018-05-08 DIAGNOSIS — J209 Acute bronchitis, unspecified: Secondary | ICD-10-CM | POA: Diagnosis not present

## 2018-05-08 DIAGNOSIS — R0981 Nasal congestion: Secondary | ICD-10-CM | POA: Diagnosis not present

## 2018-05-31 HISTORY — PX: CHOLECYSTECTOMY: SHX55

## 2018-06-13 DIAGNOSIS — R634 Abnormal weight loss: Secondary | ICD-10-CM | POA: Diagnosis not present

## 2018-06-13 DIAGNOSIS — H5712 Ocular pain, left eye: Secondary | ICD-10-CM | POA: Diagnosis not present

## 2018-06-13 DIAGNOSIS — D649 Anemia, unspecified: Secondary | ICD-10-CM | POA: Diagnosis not present

## 2018-06-13 DIAGNOSIS — H5711 Ocular pain, right eye: Secondary | ICD-10-CM | POA: Diagnosis not present

## 2018-06-13 DIAGNOSIS — E039 Hypothyroidism, unspecified: Secondary | ICD-10-CM | POA: Diagnosis not present

## 2018-06-18 DIAGNOSIS — R310 Gross hematuria: Secondary | ICD-10-CM | POA: Diagnosis not present

## 2018-06-18 DIAGNOSIS — M545 Low back pain: Secondary | ICD-10-CM | POA: Diagnosis not present

## 2018-06-18 DIAGNOSIS — R1013 Epigastric pain: Secondary | ICD-10-CM | POA: Diagnosis not present

## 2018-06-19 ENCOUNTER — Other Ambulatory Visit: Payer: Self-pay

## 2018-06-19 ENCOUNTER — Emergency Department (HOSPITAL_COMMUNITY)
Admission: EM | Admit: 2018-06-19 | Discharge: 2018-06-19 | Disposition: A | Discharge status: Home / Self Care | Payer: Commercial Managed Care - PPO | Attending: Emergency Medicine | Admitting: Emergency Medicine

## 2018-06-19 ENCOUNTER — Encounter (HOSPITAL_COMMUNITY): Payer: Self-pay | Admitting: Emergency Medicine

## 2018-06-19 ENCOUNTER — Emergency Department (HOSPITAL_COMMUNITY): Payer: Commercial Managed Care - PPO

## 2018-06-19 DIAGNOSIS — Z79899 Other long term (current) drug therapy: Secondary | ICD-10-CM | POA: Insufficient documentation

## 2018-06-19 DIAGNOSIS — K828 Other specified diseases of gallbladder: Secondary | ICD-10-CM

## 2018-06-19 DIAGNOSIS — K5900 Constipation, unspecified: Secondary | ICD-10-CM | POA: Diagnosis not present

## 2018-06-19 DIAGNOSIS — K8050 Calculus of bile duct without cholangitis or cholecystitis without obstruction: Secondary | ICD-10-CM | POA: Diagnosis not present

## 2018-06-19 DIAGNOSIS — R1013 Epigastric pain: Secondary | ICD-10-CM | POA: Diagnosis present

## 2018-06-19 LAB — URINALYSIS, ROUTINE W REFLEX MICROSCOPIC
Bilirubin Urine: NEGATIVE
Glucose, UA: 50 mg/dL — AB
Ketones, ur: NEGATIVE mg/dL
Leukocytes,Ua: NEGATIVE
Nitrite: NEGATIVE
Protein, ur: NEGATIVE mg/dL
Specific Gravity, Urine: 1.006 (ref 1.005–1.030)
pH: 6 (ref 5.0–8.0)

## 2018-06-19 LAB — CBC WITH DIFFERENTIAL/PLATELET
Abs Immature Granulocytes: 0.02 10*3/uL (ref 0.00–0.07)
Basophils Absolute: 0 10*3/uL (ref 0.0–0.1)
Basophils Relative: 1 %
Eosinophils Absolute: 0.2 10*3/uL (ref 0.0–0.5)
Eosinophils Relative: 3 %
HCT: 37.3 % (ref 36.0–46.0)
Hemoglobin: 11.3 g/dL — ABNORMAL LOW (ref 12.0–15.0)
Immature Granulocytes: 0 %
Lymphocytes Relative: 22 %
Lymphs Abs: 1.5 10*3/uL (ref 0.7–4.0)
MCH: 25.3 pg — ABNORMAL LOW (ref 26.0–34.0)
MCHC: 30.3 g/dL (ref 30.0–36.0)
MCV: 83.6 fL (ref 80.0–100.0)
Monocytes Absolute: 0.5 10*3/uL (ref 0.1–1.0)
Monocytes Relative: 7 %
Neutro Abs: 4.4 10*3/uL (ref 1.7–7.7)
Neutrophils Relative %: 67 %
Platelets: 225 10*3/uL (ref 150–400)
RBC: 4.46 MIL/uL (ref 3.87–5.11)
RDW: 16.7 % — ABNORMAL HIGH (ref 11.5–15.5)
WBC: 6.6 10*3/uL (ref 4.0–10.5)
nRBC: 0 % (ref 0.0–0.2)

## 2018-06-19 LAB — COMPREHENSIVE METABOLIC PANEL
ALT: 17 U/L (ref 0–44)
AST: 20 U/L (ref 15–41)
Albumin: 4 g/dL (ref 3.5–5.0)
Alkaline Phosphatase: 84 U/L (ref 38–126)
Anion gap: 4 — ABNORMAL LOW (ref 5–15)
BUN: 13 mg/dL (ref 6–20)
CO2: 26 mmol/L (ref 22–32)
Calcium: 9.3 mg/dL (ref 8.9–10.3)
Chloride: 105 mmol/L (ref 98–111)
Creatinine, Ser: 0.78 mg/dL (ref 0.44–1.00)
GFR calc Af Amer: >60 mL/min (ref >60)
GFR calc non Af Amer: >60 mL/min (ref >60)
Glucose, Bld: 155 mg/dL — ABNORMAL HIGH (ref 70–99)
Potassium: 4 mmol/L (ref 3.5–5.1)
Sodium: 135 mmol/L (ref 135–145)
Total Bilirubin: 0.9 mg/dL (ref 0.3–1.2)
Total Protein: 7.2 g/dL (ref 6.5–8.1)

## 2018-06-19 LAB — I-STAT BETA HCG BLOOD, ED (MC, WL, AP ONLY): I-stat hCG, quantitative: <5 m[IU]/mL (ref <5)

## 2018-06-19 LAB — LIPASE, BLOOD: Lipase: 35 U/L (ref 11–51)

## 2018-06-19 MED ORDER — FAMOTIDINE IN NACL 20-0.9 MG/50ML-% IV SOLN
20.0000 mg | Freq: Once | INTRAVENOUS | Status: AC
  Administered 2018-06-19: 20 mg via INTRAVENOUS
  Filled 2018-06-19: qty 50

## 2018-06-19 MED ORDER — IOHEXOL 300 MG/ML  SOLN
100.0000 mL | Freq: Once | INTRAMUSCULAR | Status: AC | PRN
  Administered 2018-06-19: 100 mL via INTRAVENOUS

## 2018-06-19 MED ORDER — ONDANSETRON HCL 4 MG PO TABS: 4.0000 mg | ORAL_TABLET | Freq: Three times a day (TID) | ORAL | 0 refills | Status: DC | PRN

## 2018-06-19 MED ORDER — SODIUM CHLORIDE (PF) 0.9 % IJ SOLN
INTRAMUSCULAR | Status: AC
  Filled 2018-06-19: qty 50

## 2018-06-19 MED ORDER — SODIUM CHLORIDE 0.9 % IV BOLUS
1000.0000 mL | Freq: Once | INTRAVENOUS | Status: AC
  Administered 2018-06-19: 1000 mL via INTRAVENOUS

## 2018-06-19 MED ORDER — LIDOCAINE VISCOUS HCL 2 % MT SOLN
15.0000 mL | Freq: Once | OROMUCOSAL | Status: AC
  Administered 2018-06-19: 15 mL via ORAL
  Filled 2018-06-19: qty 15

## 2018-06-19 MED ORDER — ONDANSETRON HCL 4 MG/2ML IJ SOLN
4.0000 mg | Freq: Once | INTRAMUSCULAR | Status: DC
  Filled 2018-06-19: qty 2

## 2018-06-19 MED ORDER — ALUM & MAG HYDROXIDE-SIMETH 200-200-20 MG/5ML PO SUSP
30.0000 mL | Freq: Once | ORAL | Status: AC
  Administered 2018-06-19: 30 mL via ORAL
  Filled 2018-06-19: qty 30

## 2018-06-19 MED ORDER — FAMOTIDINE 20 MG PO TABS: 20.0000 mg | ORAL_TABLET | Freq: Every day | ORAL | 0 refills | Status: DC

## 2018-06-19 NOTE — ED Triage Notes (Signed)
Pt c/o back pains and abd pains. Reports her PC advised her to come to ED. Reports she has been on Tramadol for her back pains. Denies n/v

## 2018-06-19 NOTE — ED Provider Notes (Signed)
[facility]-EMERGENCY DEPT Provider Note   CSN: [id number] Arrival date & time: 06/19/18  1191    History   Chief Complaint Chief Complaint  Patient presents with  . Back Pain  . Abdominal Pain    HPI Robin Franco is a 53 y.o. female presenting for evaluation of epigastric pain.  Patient states she has had intermittent episodes of epigastric pain over the past year.  For the past 2 weeks, her pain has been worsened.  She reports her pain is constant, waxing and waning in severity.  Pain is epigastric and described as a burning.  Radiates to her back and her right shoulder.  She reports associated nausea and vomited several days ago, although none recently.  She denies fevers, chills, chest pain, shortness of breath, lower abdominal pain, or abnormal bowel movements.  Patient reports urinary frequency, but denies dysuria or hematuria.  She reports a history of GERD, for which she was on Nexium.  She is no longer on Nexium due to loss of insurance, but states that when she was on Nexium her symptoms were improved.  She saw her PCP, who recommended she come to the ER for further evaluation.  She was started on Flexeril and tramadol for her pain, reports mild improvement.  She states her symptoms tend to be worse at night.  She denies tobacco, alcohol, drug, or marijuana use.  She denies NSAID use.  She reports a history of C-sections, appy, and tubal ligation.  She has a history of Bell's palsy, G6PD deficiency, and hyperthyroid.  Patient states her thyroid level is stable, checked last week.  Additional history obtained from paperwork from patient's PCP that she brought with her.  Per PCP, there was concern for possible pancreatitis versus PUD vs kidney stone. Recommended CT for further eval.      HPI  Past Medical History:  Diagnosis Date  . Bell's palsy   . G-6-PD deficiency   . IBS (irritable bowel syndrome)   . Ovarian cyst   . Thyroid disease    HYPER     There are no active problems to display for this patient.   Past Surgical History:  Procedure Laterality Date  . APPENDECTOMY    . CESAREAN SECTION    . FOOT SURGERY    . OVARIAN CYST REMOVAL    . TONSILLECTOMY    . TUBAL LIGATION       OB History    Gravida  2   Para  2   Term  2   Preterm  0   AB  0   Living  2     SAB  0   TAB  0   Ectopic  0   Multiple  0   Live Births               Home Medications    Prior to Admission medications   Medication Sig Start Date End Date Taking? Authorizing Provider  acetaminophen (TYLENOL) 325 MG tablet Take 650 mg by mouth every 6 (six) hours as needed for mild pain.    Yes [provider]  amLODipine (NORVASC) 5 MG tablet Take 5 mg by mouth daily. 06/15/18  Yes [provider]  cyclobenzaprine (FLEXERIL) 10 MG tablet Take 10 mg by mouth 3 (three) times daily as needed for muscle spasms. 06/13/18  Yes [provider]  levothyroxine (SYNTHROID) 75 MCG tablet Take 1 tablet (75 mcg total) by mouth daily before breakfast. 02/16/18  Yes  Donnetta Hutchingook, Brian, MD  traMADol (ULTRAM) 50 MG tablet Take 50 mg by mouth 3 (three) times daily as needed for severe pain. 06/15/18  Yes [provider]  azithromycin (ZITHROMAX) 250 MG tablet Take 1 tablet (250 mg total) by mouth daily. Take first 2 tablets together, then 1 every day until finished. Patient not taking: Reported on 09/11/2017 07/10/17   Janne NapoleonNeese, Hope M, NP  benzonatate (TESSALON) 100 MG capsule Take 1 capsule (100 mg total) by mouth every 8 (eight) hours. Patient not taking: Reported on 09/11/2017 07/10/17   Janne NapoleonNeese, Hope M, NP  diphenhydrAMINE (BENADRYL) 25 MG tablet Take 1 tablet (25 mg total) by mouth 3 (three) times daily. Take one tablet three times daily for two days Patient not taking: Reported on 02/16/2018 09/11/17   Gerhard MunchLockwood, Robert, MD  famotidine (PEPCID) 20 MG tablet Take 1 tablet (20 mg total) by mouth daily. 06/19/18   Amulya Quintin, PA-C   ondansetron (ZOFRAN) 4 MG tablet Take 1 tablet (4 mg total) by mouth every 8 (eight) hours as needed. 06/19/18   Tibor Lemmons, PA-C  pantoprazole (PROTONIX) 20 MG tablet Take 1 tablet (20 mg total) by mouth daily. Patient not taking: Reported on 06/19/2018 02/16/18   Donnetta Hutchingook, Brian, MD  predniSONE (DELTASONE) 20 MG tablet Take 2 tablets (40 mg total) by mouth daily with breakfast. For the next four days Patient not taking: Reported on 02/16/2018 09/11/17   Gerhard MunchLockwood, Robert, MD    Family History No family history on file.  Social History Social History   Tobacco Use  . Smoking status: Never Smoker  . Smokeless tobacco: Never Used  Substance Use Topics  . Alcohol use: Yes    Comment: occ  . Drug use: No     Allergies   Aspirin; Caffeine; and Sulfa antibiotics   Review of Systems Review of Systems  Gastrointestinal: Positive for abdominal pain, nausea and vomiting.  Genitourinary: Positive for frequency.  All other systems reviewed and are negative.    Physical Exam Updated Vital Signs BP (!) 144/90   Pulse 90   Temp 98.6 F (37 C) (Oral)   Resp 16   LMP 06/05/2018   SpO2 100%   Physical Exam Vitals signs and nursing note reviewed.  Constitutional:      General: She is not in acute distress.    Appearance: She is well-developed.     Comments: Appears nontoxic  HENT:     Head: Normocephalic and atraumatic.  Eyes:     Conjunctiva/sclera: Conjunctivae normal.     Pupils: Pupils are equal, round, and reactive to light.  Neck:     Musculoskeletal: Normal range of motion and neck supple.  Cardiovascular:     Rate and Rhythm: Normal rate and regular rhythm.  Pulmonary:     Effort: Pulmonary effort is normal. No respiratory distress.     Breath sounds: Normal breath sounds. No wheezing.  Abdominal:     General: There is no distension.     Palpations: Abdomen is soft. There is no mass.     Tenderness: There is abdominal tenderness. There is no right CVA tenderness,  left CVA tenderness, guarding or rebound.     Comments: ttp of upper abd (epigastric, RUQ and LUQ). + murphys. No ttp of lower abd. No rigidity or distention. Negative rebound.   Musculoskeletal: Normal range of motion.  Skin:    General: Skin is warm and dry.     Capillary Refill: Capillary refill takes less than 2 seconds.  Neurological:     Mental Status: She is alert and oriented to person, place, and time.      ED Treatments / Results  Labs (all labs ordered are listed, but only abnormal results are displayed) Labs Reviewed  CBC WITH DIFFERENTIAL/PLATELET - Abnormal; Notable for the following components:      Result Value   Hemoglobin 11.3 (*)    MCH 25.3 (*)    RDW 16.7 (*)    All other components within normal limits  COMPREHENSIVE METABOLIC PANEL - Abnormal; Notable for the following components:   Glucose, Bld 155 (*)    Anion gap 4 (*)    All other components within normal limits  URINALYSIS, ROUTINE W REFLEX MICROSCOPIC - Abnormal; Notable for the following components:   Glucose, UA 50 (*)    Hgb urine dipstick MODERATE (*)    Bacteria, UA RARE (*)    All other components within normal limits  LIPASE, BLOOD  I-STAT BETA HCG BLOOD, ED ([facility], [facility], [facility] ONLY)    EKG None  Radiology Ct Abdomen Pelvis W Contrast  Result Date: 06/19/2018 CLINICAL DATA:  Back and abdominal pain. EXAM: CT ABDOMEN AND PELVIS WITH CONTRAST TECHNIQUE: Multidetector CT imaging of the abdomen and pelvis was performed using the standard protocol following bolus administration of intravenous contrast. CONTRAST:  OMNIPAQUE IOHEXOL 300 MG/ML  SOLN COMPARISON:  None. FINDINGS: Lower chest: Limited visualization of lower thorax demonstrates minimal dependent subpleural ground-glass atelectasis. Normal heart size. No pericardial effusion. Hepatobiliary: Normal hepatic contour.  No discrete hepatic lesions. The gallbladder is distended with minimal amount of pericholecystic fluid (representative coronal  image 32, series 4). No definitive radiopaque gallstones. No intra or extrahepatic biliary ductal dilatation. No ascites. Pancreas: Normal appearance of the pancreas. Spleen: Normal appearance of the spleen. Adrenals/Urinary Tract: There is symmetric enhancement and excretion of the bilateral kidneys. No definite renal stones on this postcontrast examination. No discrete renal lesions. No urine obstruction or perinephric stranding. Normal appearance of the bilateral adrenal glands. Normal appearance of the urinary bladder given degree of distention. Stomach/Bowel: Large colonic stool burden without evidence of enteric obstruction. Normal appearance of the terminal ileum. The appendix is not visualized compatible provided operative history. No pneumoperitoneum, pneumatosis or portal venous gas. Vascular/Lymphatic: Atherosclerotic plaque involves the bilateral common iliac arteries. The major branch vessels of the abdominal aorta appear patent on this non CTA examination. No bulky retroperitoneal, mesenteric, pelvic or inguinal lymphadenopathy. Reproductive: Note is made of an approximately 2.8 x 2.3 cm hypoattenuating right-sided presumably physiologic adnexal cyst. No discrete left-sided adnexal lesions. Trace amount of presumably physiologic free fluid in the pelvic cul-de-sac. Other: Minimal amount of body wall anasarca. Musculoskeletal: No acute or aggressive osseous abnormalities. IMPRESSION: 1. Distended gallbladder with suspected minimal amount of pericholecystic fluid though no definitive radiopaque gallstones. If there remains clinical concern for acute cholecystitis, further evaluation with right upper quadrant abdominal ultrasound could be performed as indicated. 2. Large colonic stool burden without evidence of enteric obstruction. 3. Otherwise, no explanation for patient's abdominal pain. Specifically, no evidence of urinary obstruction. Post appendectomy. 4.  Aortic Atherosclerosis (ICD10-I70.0).  Electronically Signed   By: Simonne Come M.D.   On: 06/19/2018 10:01    Procedures Procedures (including critical care time)  Medications Ordered in ED Medications  sodium chloride 0.9 % bolus 1,000 mL (0 mLs Intravenous Stopped 06/19/18 0935)  famotidine (PEPCID) IVPB 20 mg premix (0 mg Intravenous Stopped 06/19/18 0834)  alum & mag hydroxide-simeth (MAALOX/MYLANTA) 200-200-20 MG/5ML suspension  30 mL (30 mLs Oral Given 06/19/18 0749)    And  lidocaine (XYLOCAINE) 2 % viscous mouth solution 15 mL (15 mLs Oral Given 06/19/18 0743)  iohexol (OMNIPAQUE) 300 MG/ML solution 100 mL (100 mLs Intravenous Contrast Given 06/19/18 0939)     Initial Impression / Assessment and Plan / ED Course  I have reviewed the triage vital signs and the nursing notes.  Pertinent labs & imaging results that were available during my care of the patient were reviewed by me and considered in my medical decision making (see chart for details).        Patient presenting for evaluation of abdominal pain.  Physical examination, she is afebrile and appears nontoxic.  Tenderness palpation of upper abdomen.  Positive Murphy's.  As symptoms have been intermittent for the past year, consider GERD versus gallbladder etiology.  Also consider PUD.  Favor this over pancreatitis, obstruction, or perforation.  However, as primary care was concerned about these causes, will obtain CT for further evaluation and hold on ultrasound.  Will obtain basic labs.  Fluids, antiemetics, and pepcid and GI cocktail for sx control.   Labs reassuring.  Hemoglobin stable.  No leukocytosis.  Kidney, liver, pancreatic function reassuring.  On reassessment, patient reports symptoms are improved, but not completely resolved.  Urine without infection.  CT pending.  CT shows gallbladder distention with sludge and constipation.  Otherwise reassuring.  As patient is afebrile, without a white count, and without obvious signs of cholecystitis, I do not believe  ultrasound would be beneficial today.  Likely biliary colic, but doubt cholecystitis.  Discussed findings with patient.  Discussed treatment with MiraLAX, symptom control her gallbladder, and follow-up with general surgery for further evaluation.  At this time, patient appears safe for discharge.  Return precautions given.  Patient states she understands and agrees plan.   Final Clinical Impressions(s) / ED Diagnoses   Final diagnoses:  Gallbladder sludge  Biliary colic symptom  Constipation, unspecified constipation type    ED Discharge Orders         Ordered    ondansetron (ZOFRAN) 4 MG tablet  Every 8 hours PRN     06/19/18 1033    famotidine (PEPCID) 20 MG tablet  Daily     06/19/18 1033           Iceis Knab, PA-C 06/19/18 1522    Virgina Norfolk, DO 06/19/18 1538

## 2018-06-19 NOTE — Discharge Instructions (Addendum)
Use MiraLAX daily.  Use 1-2 capfuls as needed until having daily bowel movements. Make sure you are staying well-hydrated water. Take Pepcid daily to help with reflux/burning. Use Zofran as needed for nausea or vomiting. Use your at home tramadol as needed for severe pain. Be careful with your diet.  Avoid greasy or fatty foods.  It is also important that you are staying well-hydrated water.  There is information about this in the paperwork. Follow-up with general surgery for further evaluation and management of your gallbladder. Return to the emergency room if you develop high fevers, persistent vomiting, severe worsening pain not improved with medication, or any new, worsening, or concerning symptoms.

## 2018-06-25 ENCOUNTER — Ambulatory Visit: Payer: Self-pay | Admitting: Surgery

## 2018-06-25 ENCOUNTER — Encounter: Payer: Self-pay | Admitting: Surgery

## 2018-06-25 DIAGNOSIS — K219 Gastro-esophageal reflux disease without esophagitis: Secondary | ICD-10-CM | POA: Insufficient documentation

## 2018-06-25 DIAGNOSIS — E079 Disorder of thyroid, unspecified: Secondary | ICD-10-CM | POA: Insufficient documentation

## 2018-06-25 DIAGNOSIS — K589 Irritable bowel syndrome without diarrhea: Secondary | ICD-10-CM | POA: Insufficient documentation

## 2018-06-25 DIAGNOSIS — G510 Bell's palsy: Secondary | ICD-10-CM | POA: Insufficient documentation

## 2018-06-25 DIAGNOSIS — K812 Acute cholecystitis with chronic cholecystitis: Secondary | ICD-10-CM | POA: Diagnosis not present

## 2018-06-25 DIAGNOSIS — D75A Glucose-6-phosphate dehydrogenase (G6PD) deficiency without anemia: Secondary | ICD-10-CM | POA: Insufficient documentation

## 2018-06-25 NOTE — H&P (Signed)
Robin Franco Documented: 06/25/2018 2:06 PM Location: [facility] Patient #: [id number] DOB: 02/14/1966 Single / Language: English / Race: Black or African American Female  History of Present Illness (Martine Trageser C. Coriann Brouhard MD; 06/25/2018 2:36 PM) The patient is a 53 year old female who presents for evaluation of gall stones. Note for "Gall stones": ` ` ` Patient sent for surgical consultation at the request of Dr Koirala  Chief Complaint: Abdominal pain. Probable symptomatically gallstones ` ` The patient is a 53 year old female with history of G6PD deficiency, GERD, hypothyroidism, chronic back pain on chronic narcotics. An episode of abdominal pain in the upper abdomen and epigastric. History of GERD on Nexium. She did not seem like that. Pain became more intense and focal in the upper abdomen on the right side. Since emergency room. CT scan showed some pericholecystic fluid. No definite stones. Pain stabilized without leukocytosis. Recommendation to consider surgical evaluation.  Patient comes a by herself. She notes she's being tramadol to help tolerate the pain. Doing some Flexeril. Pain is mainly on the right side the abdomen but radiates to her back and shoulder. He's had increasing nausea. She's thrown up a few times. She'll try to simplify her diet. She does have a history of reflux. Recently switch to Pepcid. She does not feel like that is it. No improvement taking extra dose. 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 She does not smoke. She did have C-sections and removal of an ovarian cyst. History of appendectomy. No recent abdominal surgeries in the past 10 years. Her mother had similar symptoms and required her gallbladder be removed. The patient has no history of any kidney stones or renal issues. No vaginal bleeding or discharge. Normally moves her bowels at least every other day. No melena bouts of  constipation or diarrhea. No fevers chills or sweats. No personal nor family history of GI/colon cancer, inflammatory bowel disease, irritable bowel syndrome, allergy such as Celiac Sprue, dietary/dairy problems, colitis, ulcers nor gastritis. No recent sick contacts/gastroenteritis. No travel outside the country. No changes in diet. No dysphagia to solids or liquids. No significant heartburn or reflux. No hematochezia, hematemesis, coffee ground emesis. No evidence of prior gastric/peptic ulceration. (Review of systems as stated in this history (HPI) or in the review of systems. Otherwise all other 12 point ROS are negative) ` ` `   Past Surgical History (Alisha Spillers, CMA; 06/25/2018 2:11 PM) Appendectomy Cesarean Section - 1 Mammoplasty; Reduction Bilateral. Tonsillectomy  Diagnostic Studies History (Alisha Spillers, CMA; 06/25/2018 2:11 PM) Colonoscopy never Pap Smear never  Allergies (Alisha Spillers, CMA; 06/25/2018 2:27 PM) Sulfacetamide *CHEMICALS* Aspirin *ANALGESICS - NonNarcotic* Caffeine *ADHD/ANTI-NARCOLEPSY/ANTI-OBESITY/ANOREXIANTS*  Medication History (Alisha Spillers, CMA; 06/25/2018 2:13 PM) traMADol HCl (50MG  Tablet, Oral) Active. amLODIPine Besylate (5MG  Tablet, Oral) Active. Levothyroxine Sodium (<MEASURE<MEASURDal6644Volcano G<MEASUREMAbraha6644 ddOrlando Fl Endoscopy Asc LLC Dba CPrescott664<MEASUREPl664 arJack 6<MEAS<MEASFa6644<MEASURMethodist Hosp6644Mor soMunsonSouth Tex664<MEASUREMENKindr 64Paradis6644Carolina Meadowsptist Health Corbinthcations Reconciled  Social History (Alisha Spillers, CMA; 06/25/2018 2:11 PM) Alcohol use Occasional alcohol use. Caffeine use Coffee. Illicit drug use Remotely quit drug use. Tobacco use Never smoker.  Family History (Alisha Spillers, CMA; 06/25/2018 2:11 PM) Alcohol Abuse Father. Arthritis Mother. Colon Polyps Father. Hypertension Brother, Father, Mother.  Pregnancy / Birth History (Alisha Spillers, CMA; 06/25/2018 2:11 PM) Age at menarche 18 years. Gravida 2 Irregular periods Maternal age 26-20 Para 2  Other Problems (Alisha Spillers, CMA; 06/25/2018 2:11 PM) Back Pain Gastroesophageal  Reflux Disease High blood pressure Oophorectomy Left. Thyroid Disease     Review of Systems (Alisha Spillers CMA; 06/25/2018 2:11 PM)  General Present- Appetite Loss, Fatigue, Night Sweats and Weight Loss. Not Present- Chills, Fever and Weight Gain. Skin Present- Dryness. Not Present- Change in Wart/Mole, Hives, Jaundice, New Lesions, Non-Healing Wounds, Rash and Ulcer. HEENT Present- Seasonal Allergies. Not Present- Earache, Hearing Loss, Hoarseness, Nose Bleed, Oral Ulcers, Ringing in the Ears, Sinus Pain, Sore Throat, Visual Disturbances, Wears glasses/contact lenses and Yellow Eyes. Breast Not Present- Breast Mass, Breast Pain, Nipple Discharge and Skin Changes. Cardiovascular Present- Difficulty Breathing Lying Down. Not Present- Chest Pain, Leg Cramps, Palpitations, Rapid Heart Rate, Shortness of Breath and Swelling of Extremities. Gastrointestinal Present- Abdominal Pain, Indigestion, Nausea and Vomiting. Not Present- Bloating, Bloody Stool, Change in Bowel Habits, Chronic diarrhea, Constipation, Difficulty Swallowing, Excessive gas, Gets full quickly at meals, Hemorrhoids and Rectal Pain. Female Genitourinary Present- Frequency. Not Present- Nocturia, Painful Urination, Pelvic Pain and Urgency. Musculoskeletal Present- Back Pain. Not Present- Joint Pain, Joint Stiffness, Muscle Pain, Muscle Weakness and Swelling of Extremities. Endocrine Present- Hot flashes. Not Present- Cold Intolerance, Excessive Hunger, Hair Changes, Heat Intolerance and New Diabetes. Hematology Present- Gland problems. Not Present- Blood Thinners, Easy Bruising, Excessive bleeding, HIV and Persistent Infections.  Vitals (Alisha Spillers CMA; 06/25/2018 2:12 PM) 06/25/2018 2:12 PM Weight: 132 lb Height: 65in Body Surface Area: 1.66 m Body Mass Index: 21.97 kg/m  Temp.: 63F(Oral)  Pulse: 86 (Regular)  BP: 144/82 (Sitting, Left Arm, Standard)      Physical Exam Ardeth Sportsman MD; 06/25/2018  2:22 PM)  General Mental Status-Alert. General Appearance-Not in acute distress, Not Sickly. Orientation-Oriented X3. Hydration-Well hydrated. Voice-Normal. Note: Bright and alert. Not acutely toxic but obviously uncomfortable  Integumentary Global Assessment Upon inspection and palpation of skin surfaces of the - Axillae: non-tender, no inflammation or ulceration, no drainage. and Distribution of scalp and body hair is normal. General Characteristics Temperature - normal warmth is noted.  Head and Neck Head-normocephalic, atraumatic with no lesions or palpable masses. Face Global Assessment - atraumatic, no absence of expression. Neck Global Assessment - no abnormal movements, no bruit auscultated on the right, no bruit auscultated on the left, no decreased range of motion, non-tender. Trachea-midline. Thyroid Gland Characteristics - non-tender.  Eye Eyeball - Left-Extraocular movements intact, No Nystagmus. Eyeball - Right-Extraocular movements intact, No Nystagmus. Cornea - Left-No Hazy. Cornea - Right-No Hazy. Sclera/Conjunctiva - Left-No scleral icterus, No Discharge. Sclera/Conjunctiva - Right-No scleral icterus, No Discharge. Pupil - Left-Direct reaction to light normal. Pupil - Right-Direct reaction to light normal.  ENMT Ears Pinna - Left - no drainage observed, no generalized tenderness observed. Right - no drainage observed, no generalized tenderness observed. Nose and Sinuses External Inspection of the Nose - no destructive lesion observed. Inspection of the nares - Left - quiet respiration. Right - quiet respiration. Mouth and Throat Lips - Upper Lip - no fissures observed, no pallor noted. Lower Lip - no fissures observed, no pallor noted. Nasopharynx - no discharge present. Oral Cavity/Oropharynx - Tongue - no dryness observed. Oral Mucosa - no cyanosis observed. Hypopharynx - no evidence of airway distress observed.  Chest and  Lung Exam Inspection Movements - Normal and Symmetrical. Accessory muscles - No use of accessory muscles in breathing. Palpation Palpation of the chest reveals - Non-tender. Auscultation Breath sounds - Normal and Clear.  Cardiovascular Auscultation Rhythm - Regular. Murmurs & Other Heart Sounds - Auscultation of the heart reveals - No Murmurs and No Systolic Clicks.  Abdomen Inspection Inspection of the abdomen reveals - No Visible peristalsis and No Abnormal pulsations. Umbilicus - No Bleeding,  No Urine drainage. Palpation/Percussion Palpation and Percussion of the abdomen reveal - Soft, Non Tender, No Rebound tenderness, No Rigidity (guarding) and No Cutaneous hyperesthesia. Note: Tenderness to palpation on right side of abdomen more than left upper quadrant. Especially in right upper quadrant with Murphy sign and guarding  Abdomen soft. Not severely distended. No distasis recti. No umbilical or other anterior abdominal wall hernias  Female Genitourinary Sexual Maturity Tanner 5 - Adult hair pattern. Note: No vaginal bleeding nor discharge  Peripheral Vascular Upper Extremity Inspection - Left - No Cyanotic nailbeds, Not Ischemic. Right - No Cyanotic nailbeds, Not Ischemic.  Neurologic Neurologic evaluation reveals -normal attention span and ability to concentrate, able to name objects and repeat phrases. Appropriate fund of knowledge , normal sensation and normal coordination. Mental Status Affect - not angry, not paranoid. Cranial Nerves-Normal Bilaterally. Gait-Normal.  Neuropsychiatric Mental status exam performed with findings of-able to articulate well with normal speech/language, rate, volume and coherence, thought content normal with ability to perform basic computations and apply abstract reasoning and no evidence of hallucinations, delusions, obsessions or homicidal/suicidal ideation.  Musculoskeletal Global Assessment Spine, Ribs and Pelvis - no  instability, subluxation or laxity. Right Upper Extremity - no instability, subluxation or laxity.  Lymphatic Head & Neck  General Head & Neck Lymphatics: Bilateral - Description - No Localized lymphadenopathy. Axillary  General Axillary Region: Bilateral - Description - No Localized lymphadenopathy. Femoral & Inguinal  Generalized Femoral & Inguinal Lymphatics: Left - Description - No Localized lymphadenopathy. Right - Description - No Localized lymphadenopathy.    Assessment & Plan Ardeth Sportsman MD; 06/25/2018 2:34 PM)  ACUTE CHOLECYSTITIS WITH CHRONIC CHOLECYSTITIS (K81.2) Impression: Severe right upper quadrant pain constant with nausea and vomiting very suspicious for acute cholecystitis.  Augmentin oral antibiotics.  Zofran nausea medications.  Urgent cholecystectomy. She's getting the point where the pain is very intense. She needs narcotics to control. Getting nauseated. I don't think she can wait any more. Trying get this on in the next few days as an outpatient. Otherwise may need to admit to the urgent surgical service. Start single site that decent likelihood she may need a 4 port cholecystectomy. May need a drain. May need hospitalization. We will see.  Current Plans You are being scheduled for surgery- Our schedulers will call you.  You should hear from our office's scheduling department within 5 working days about the location, date, and time of surgery. We try to make accommodations for patient's preferences in scheduling surgery, but sometimes the OR schedule or the surgeon's schedule prevents [place] from making those accommodations.  If you have not heard from our office [phone]) in 5 working days, call the office and ask for your surgeon's nurse.  If you have other questions about your diagnosis, plan, or surgery, call the office and ask for your surgeon's nurse.  Written instructions provided Pt Education - Pamphlet Given - Laparoscopic Gallbladder  Surgery: discussed with patient and provided information. The anatomy & physiology of hepatobiliary & pancreatic function was discussed. The pathophysiology of gallbladder dysfunction was discussed. Natural history risks without surgery was discussed. I feel the risks of no intervention will lead to serious problems that outweigh the operative risks; therefore, I recommended cholecystectomy to remove the pathology. I explained laparoscopic techniques with possible need for an open approach. Probable cholangiogram to evaluate the bilary tract was explained as well.  Risks such as bleeding, infection, abscess, leak, injury to other organs, need for further treatment, heart attack, death, and other risks  were discussed. I noted a good likelihood this will help address the problem. Possibility that this will not correct all abdominal symptoms was explained. Goals of post-operative recovery were discussed as well. We will work to minimize complications. An educational handout further explaining the pathology and treatment options was given as well. Questions were answered. The patient expresses understanding & wishes to proceed with surgery.  Pt Education - CCS Laparosopic Post Op HCI (Tallan Sandoz) Pt Education - CCS Good Bowel Health (Arayla Kruschke) Pt Education - Laparoscopic Cholecystectomy: gallbladder Started Amoxicillin-Pot Clavulanate 875-125 MG Oral Tablet, 1 (one) Tablet two times daily, #10, 06/25/2018, Ref. x1. Started Ondansetron 4 MG Oral Tablet Disintegrating, 1 (one) Tablet three times daily, as needed, #8, 06/25/2018, Ref. Beverlyn Roux, MD, FACS, MASCRS Gastrointestinal and Minimally Invasive Surgery    [address] [place], [place] [address] [phone] Main / Paging [phone] Fax

## 2018-06-29 ENCOUNTER — Other Ambulatory Visit: Payer: Self-pay | Admitting: Surgery

## 2018-06-29 DIAGNOSIS — K811 Chronic cholecystitis: Secondary | ICD-10-CM | POA: Diagnosis not present

## 2018-06-29 DIAGNOSIS — K828 Other specified diseases of gallbladder: Secondary | ICD-10-CM | POA: Diagnosis not present

## 2018-06-29 DIAGNOSIS — K812 Acute cholecystitis with chronic cholecystitis: Secondary | ICD-10-CM | POA: Diagnosis not present

## 2019-04-13 ENCOUNTER — Other Ambulatory Visit: Payer: Self-pay

## 2019-04-13 ENCOUNTER — Encounter (HOSPITAL_COMMUNITY): Payer: Self-pay | Admitting: *Deleted

## 2019-04-13 ENCOUNTER — Emergency Department (HOSPITAL_COMMUNITY)
Admission: EM | Admit: 2019-04-13 | Discharge: 2019-04-13 | Disposition: A | Discharge status: Home / Self Care | Payer: Self-pay | Attending: Emergency Medicine | Admitting: Emergency Medicine

## 2019-04-13 ENCOUNTER — Emergency Department (HOSPITAL_COMMUNITY): Payer: Self-pay

## 2019-04-13 DIAGNOSIS — Z79899 Other long term (current) drug therapy: Secondary | ICD-10-CM | POA: Insufficient documentation

## 2019-04-13 DIAGNOSIS — F17200 Nicotine dependence, unspecified, uncomplicated: Secondary | ICD-10-CM | POA: Insufficient documentation

## 2019-04-13 DIAGNOSIS — I10 Essential (primary) hypertension: Secondary | ICD-10-CM | POA: Insufficient documentation

## 2019-04-13 DIAGNOSIS — G510 Bell's palsy: Secondary | ICD-10-CM | POA: Insufficient documentation

## 2019-04-13 HISTORY — DX: Essential (primary) hypertension: I10

## 2019-04-13 MED ORDER — DEXAMETHASONE SODIUM PHOSPHATE 10 MG/ML IJ SOLN
10.0000 mg | Freq: Once | INTRAMUSCULAR | Status: AC
  Administered 2019-04-13: 15:00:00 10 mg via INTRAVENOUS
  Filled 2019-04-13: qty 1

## 2019-04-13 MED ORDER — PREDNISONE 50 MG PO TABS: 50.0000 mg | ORAL_TABLET | Freq: Every day | ORAL | 0 refills | Status: DC

## 2019-04-13 MED ORDER — KETOROLAC TROMETHAMINE 30 MG/ML IJ SOLN
30.0000 mg | Freq: Once | INTRAMUSCULAR | Status: AC
  Administered 2019-04-13: 15:00:00 30 mg via INTRAVENOUS
  Filled 2019-04-13: qty 1

## 2019-04-13 MED ORDER — SODIUM CHLORIDE 0.9 % IV BOLUS
1000.0000 mL | Freq: Once | INTRAVENOUS | Status: AC
  Administered 2019-04-13: 15:00:00 1000 mL via INTRAVENOUS

## 2019-04-13 MED ORDER — VALACYCLOVIR HCL 1 G PO TABS: 1000.0000 mg | ORAL_TABLET | Freq: Three times a day (TID) | ORAL | 0 refills | Status: DC

## 2019-04-13 NOTE — ED Provider Notes (Signed)
[facility]-EMERGENCY DEPT Provider Note   CSN: [id number] Arrival date & time: 04/13/19  1237     History Chief Complaint  Patient presents with  . Headache  . Bell's Palsy    Robin Franco is a 54 y.o. female.  HPI Patient presents to the emergency department with what she is concerned is Bell's palsy.  The patient states she is had this in the past.  The patient states that she is having trouble closing her left eye and her left mouth is drooping.  She also has decreased taste sensation in the left side of her mouth she feels like.  Patient states that this feels similar to previous episode that she had.  The patient denies chest pain, shortness of breath, headache,blurred vision, neck pain, fever, cough, weakness, numbness, dizziness, anorexia, edema, abdominal pain, nausea, vomiting, diarrhea, rash, back pain, dysuria, hematemesis, bloody stool, near syncope, or syncope.    Past Medical History:  Diagnosis Date  . Bell's palsy   . G-6-PD deficiency   . GERD (gastroesophageal reflux disease)   . Hypertension   . IBS (irritable bowel syndrome)   . Ovarian cyst   . Thyroid disease    HYPER    Patient Active Problem List   Diagnosis Date Noted  . Acute on chronic cholecystitis 06/25/2018  . G-6-PD deficiency   . IBS (irritable bowel syndrome)   . Thyroid disease   . Bell's palsy   . GERD (gastroesophageal reflux disease)     Past Surgical History:  Procedure Laterality Date  . APPENDECTOMY    . CESAREAN SECTION    . FOOT SURGERY    . OVARIAN CYST REMOVAL    . TONSILLECTOMY    . TUBAL LIGATION       OB History    Gravida  2   Para  2   Term  2   Preterm  0   AB  0   Living  2     SAB  0   TAB  0   Ectopic  0   Multiple  0   Live Births              No family history on file.  Social History   Tobacco Use  . Smoking status: Current Every Day Smoker  . Smokeless tobacco: Never Used  Substance Use Topics  .  Alcohol use: Yes    Comment: occ  . Drug use: No    Home Medications Prior to Admission medications   Medication Sig Start Date End Date Taking? Authorizing Provider  acetaminophen (TYLENOL) 325 MG tablet Take 650 mg by mouth every 6 (six) hours as needed for mild pain.    Yes [provider]  amLODipine (NORVASC) 5 MG tablet Take 5 mg by mouth daily. 06/15/18  Yes [provider]  levothyroxine (SYNTHROID) 75 MCG tablet Take 1 tablet (75 mcg total) by mouth daily before breakfast. 02/16/18  Yes Donnetta Hutching, MD  azithromycin (ZITHROMAX) 250 MG tablet Take 1 tablet (250 mg total) by mouth daily. Take first 2 tablets together, then 1 every day until finished. Patient not taking: Reported on 04/13/2019 07/10/17   Janne Napoleon, NP  benzonatate (TESSALON) 100 MG capsule Take 1 capsule (100 mg total) by mouth every 8 (eight) hours. Patient not taking: Reported on 09/11/2017 07/10/17   Janne Napoleon, NP  diphenhydrAMINE (BENADRYL) 25 MG tablet Take 1 tablet (25 mg total) by mouth 3 (three) times daily. Take  one tablet three times daily for two days Patient not taking: Reported on 02/16/2018 09/11/17   Gerhard Munch, MD  famotidine (PEPCID) 20 MG tablet Take 1 tablet (20 mg total) by mouth daily. Patient not taking: Reported on 04/13/2019 06/19/18   Caccavale, Sophia, PA-C  ondansetron (ZOFRAN) 4 MG tablet Take 1 tablet (4 mg total) by mouth every 8 (eight) hours as needed. Patient not taking: Reported on 04/13/2019 06/19/18   Caccavale, Sophia, PA-C  pantoprazole (PROTONIX) 20 MG tablet Take 1 tablet (20 mg total) by mouth daily. Patient not taking: Reported on 06/19/2018 02/16/18   Donnetta Hutching, MD  predniSONE (DELTASONE) 20 MG tablet Take 2 tablets (40 mg total) by mouth daily with breakfast. For the next four days Patient not taking: Reported on 02/16/2018 09/11/17   Gerhard Munch, MD    Allergies    Aspirin, Caffeine, and Sulfa antibiotics  Review of Systems   Review of Systems All other  systems negative except as documented in the HPI. All pertinent positives and negatives as reviewed in the HPI. Physical Exam Updated Vital Signs BP (!) 148/98   Pulse 73   Temp 98.9 F (37.2 C) (Oral)   Resp 18   Ht 5\' 4"  (1.626 m)   Wt 64.9 kg   SpO2 100%   BMI 24.55 kg/m   Physical Exam Vitals and nursing note reviewed.  Constitutional:      General: She is not in acute distress.    Appearance: She is well-developed.  HENT:     Head: Normocephalic and atraumatic.  Eyes:     Pupils: Pupils are equal, round, and reactive to light.  Cardiovascular:     Rate and Rhythm: Normal rate and regular rhythm.     Heart sounds: Normal heart sounds. No murmur. No friction rub. No gallop.   Pulmonary:     Effort: Pulmonary effort is normal. No respiratory distress.     Breath sounds: Normal breath sounds. No wheezing.  Abdominal:     General: Bowel sounds are normal. There is no distension.     Palpations: Abdomen is soft.     Tenderness: There is no abdominal tenderness.  Musculoskeletal:     Cervical back: Normal range of motion and neck supple.  Skin:    General: Skin is warm and dry.     Capillary Refill: Capillary refill takes less than 2 seconds.     Findings: No erythema or rash.  Neurological:     Mental Status: She is alert and oriented to person, place, and time.     GCS: GCS eye subscore is 4. GCS verbal subscore is 5. GCS motor subscore is 6.     Cranial Nerves: Facial asymmetry present.     Sensory: No sensory deficit.     Motor: No abnormal muscle tone.     Coordination: Coordination normal.     Gait: Gait normal.  Psychiatric:        Speech: Speech normal.        Behavior: Behavior normal.     ED Results / Procedures / Treatments   Labs (all labs ordered are listed, but only abnormal results are displayed) Labs Reviewed - No data to display  EKG None  Radiology CT Head Wo Contrast  Result Date: 04/13/2019 CLINICAL DATA:  Headache. Intracranial  hemorrhage suspected. Hypertension. EXAM: CT HEAD WITHOUT CONTRAST TECHNIQUE: Contiguous axial images were obtained from the base of the skull through the vertex without intravenous contrast. COMPARISON:  02/23/2008. FINDINGS: Brain:  No mass lesion, hemorrhage, hydrocephalus, acute infarct, intra-axial, or extra-axial fluid collection. Vascular: No hyperdense vessel or unexpected calcification. Skull: Normal Sinuses/Orbits: Normal imaged portions of the orbits and globes. Clear paranasal sinuses and mastoid air cells. Other: None. IMPRESSION: Normal head CT. Electronically Signed   By: Abigail Miyamoto M.D.   On: 04/13/2019 14:06    Procedures Procedures (including critical care time)  Medications Ordered in ED Medications  sodium chloride 0.9 % bolus 1,000 mL (1,000 mLs Intravenous New Bag/Given 04/13/19 1444)  ketorolac (TORADOL) 30 MG/ML injection 30 mg (30 mg Intravenous Given 04/13/19 1445)  dexamethasone (DECADRON) injection 10 mg (10 mg Intravenous Given 04/13/19 1445)    ED Course  I have reviewed the triage vital signs and the nursing notes.  Pertinent labs & imaging results that were available during my care of the patient were reviewed by me and considered in my medical decision making (see chart for details).    MDM Rules/Calculators/A&P                      Patient has trouble raising her left eyebrow trouble closing her left eye drooping of the left side of her mouth.  This is consistent I feel like with Bell's palsy.  She will be started on steroids along with antivirals.  Patient is advised the plan and all questions were answered.  I did advise her to follow-up with her primary doctor. Final Clinical Impression(s) / ED Diagnoses Final diagnoses:  None    Rx / DC Orders ED Discharge Orders    None       Dalia Heading, PA-C 04/13/19 Bynum, Wenda Overland, MD 04/14/19 1525

## 2019-04-13 NOTE — Discharge Instructions (Addendum)
Return here as needed.  Follow-up with the neurologist provided.  Your head CT was normal.

## 2019-04-13 NOTE — ED Triage Notes (Signed)
Has had a headache behind her eyes for a couple of weeks, yesterday worse, this morning things don't taste right, could not speak her P's and T's on phone call. Has history of Bell's Palsy, has symptoms involving rt side of face, can not close rt eye or smile on rt side.

## 2019-06-29 ENCOUNTER — Other Ambulatory Visit: Payer: Self-pay

## 2019-06-29 ENCOUNTER — Emergency Department (HOSPITAL_COMMUNITY)
Admission: EM | Admit: 2019-06-29 | Discharge: 2019-06-29 | Disposition: A | Discharge status: Home / Self Care | Payer: Self-pay | Attending: Emergency Medicine | Admitting: Emergency Medicine

## 2019-06-29 ENCOUNTER — Encounter (HOSPITAL_COMMUNITY): Payer: Self-pay

## 2019-06-29 DIAGNOSIS — S39012A Strain of muscle, fascia and tendon of lower back, initial encounter: Secondary | ICD-10-CM | POA: Insufficient documentation

## 2019-06-29 DIAGNOSIS — Z79899 Other long term (current) drug therapy: Secondary | ICD-10-CM | POA: Insufficient documentation

## 2019-06-29 DIAGNOSIS — Y990 Civilian activity done for income or pay: Secondary | ICD-10-CM | POA: Insufficient documentation

## 2019-06-29 DIAGNOSIS — F17200 Nicotine dependence, unspecified, uncomplicated: Secondary | ICD-10-CM | POA: Insufficient documentation

## 2019-06-29 DIAGNOSIS — I10 Essential (primary) hypertension: Secondary | ICD-10-CM | POA: Insufficient documentation

## 2019-06-29 DIAGNOSIS — Y9289 Other specified places as the place of occurrence of the external cause: Secondary | ICD-10-CM | POA: Insufficient documentation

## 2019-06-29 DIAGNOSIS — R197 Diarrhea, unspecified: Secondary | ICD-10-CM | POA: Insufficient documentation

## 2019-06-29 DIAGNOSIS — X500XXA Overexertion from strenuous movement or load, initial encounter: Secondary | ICD-10-CM | POA: Insufficient documentation

## 2019-06-29 DIAGNOSIS — Y9389 Activity, other specified: Secondary | ICD-10-CM | POA: Insufficient documentation

## 2019-06-29 MED ORDER — CYCLOBENZAPRINE HCL 10 MG PO TABS: 10.0000 mg | ORAL_TABLET | Freq: Every day | ORAL | 0 refills | Status: DC

## 2019-06-29 NOTE — Discharge Instructions (Signed)
For discussion, I am prescribing you a new medication called Flexeril.  This is a strong muscle relaxer that also has a sedating effect.  Please take 1 pill as needed with dinner.  Please do not mix this medication with alcohol or drive a motor vehicle after taking it.  You can continue to take Tylenol for management of your pain.  I would recommend both Tylenol and ibuprofen at low doses.  Please follow the instructions on the bottles.  Please do not hesitate to return to the emergency department with any new or worsening symptoms.  It was a pleasure to meet you.

## 2019-06-29 NOTE — ED Triage Notes (Signed)
Pt presents with c/o lower back pain on the left side that started 3 days ago. Pt denies any injury but does report that she does some lifting and pulling at work. Pt also reports having episodes of diarrhea since the pain started.

## 2019-06-29 NOTE — ED Provider Notes (Signed)
[facility]-EMERGENCY DEPT Provider Note   CSN: [id number] Arrival date & time: 06/29/19  1013     History Chief Complaint  Patient presents with  . Back Pain  . Diarrhea    Robin Franco is a 54 y.o. female.  HPI HPI Comments: Robin Franco is a 54 y.o. female who presents to the Emergency Department complaining of back pain.  Patient works in a [employer] and was doing heavy lifting at work 2 days ago.  She notes gradual onset left lower back pain since this occurred.  She has been taking Tylenol and applying heat with mild relief.  Pain worsens with movement.  She denies numbness, tingling, weakness, saddle anesthesia, bowel or bladder incontinence.  Patient additionally complains of intermittent watery brown diarrhea 2 days ago.  She states that her symptoms have mostly alleviated and notes a normal bowel movement this morning.  She denies any hematochezia.    Past Medical History:  Diagnosis Date  . Bell's palsy   . G-6-PD deficiency   . GERD (gastroesophageal reflux disease)   . Hypertension   . IBS (irritable bowel syndrome)   . Ovarian cyst   . Thyroid disease    HYPER    Patient Active Problem List   Diagnosis Date Noted  . Acute on chronic cholecystitis 06/25/2018  . G-6-PD deficiency   . IBS (irritable bowel syndrome)   . Thyroid disease   . Bell's palsy   . GERD (gastroesophageal reflux disease)     Past Surgical History:  Procedure Laterality Date  . APPENDECTOMY    . CESAREAN SECTION    . FOOT SURGERY    . OVARIAN CYST REMOVAL    . TONSILLECTOMY    . TUBAL LIGATION       OB History    Gravida  2   Para  2   Term  2   Preterm  0   AB  0   Living  2     SAB  0   TAB  0   Ectopic  0   Multiple  0   Live Births              History reviewed. No pertinent family history.  Social History   Tobacco Use  . Smoking status: Current Every Day Smoker  . Smokeless tobacco: Never Used  Substance Use Topics    . Alcohol use: Yes    Comment: occ  . Drug use: No    Home Medications Prior to Admission medications   Medication Sig Start Date End Date Taking? Authorizing Provider  acetaminophen (TYLENOL) 325 MG tablet Take 650 mg by mouth every 6 (six) hours as needed for mild pain.     [provider]  amLODipine (NORVASC) 5 MG tablet Take 5 mg by mouth daily. 06/15/18   [provider]  azithromycin (ZITHROMAX) 250 MG tablet Take 1 tablet (250 mg total) by mouth daily. Take first 2 tablets together, then 1 every day until finished. Patient not taking: Reported on 04/13/2019 07/10/17   Janne Napoleon, NP  benzonatate (TESSALON) 100 MG capsule Take 1 capsule (100 mg total) by mouth every 8 (eight) hours. Patient not taking: Reported on 09/11/2017 07/10/17   Janne Napoleon, NP  diphenhydrAMINE (BENADRYL) 25 MG tablet Take 1 tablet (25 mg total) by mouth 3 (three) times daily. Take one tablet three times daily for two days Patient not taking: Reported on 02/16/2018 09/11/17   Gerhard Munch, MD  famotidine (PEPCID) 20 MG tablet Take 1 tablet (20 mg total) by mouth daily. Patient not taking: Reported on 04/13/2019 06/19/18   Caccavale, Sophia, PA-C  levothyroxine (SYNTHROID) 75 MCG tablet Take 1 tablet (75 mcg total) by mouth daily before breakfast. 02/16/18   Nat Christen, MD  ondansetron (ZOFRAN) 4 MG tablet Take 1 tablet (4 mg total) by mouth every 8 (eight) hours as needed. Patient not taking: Reported on 04/13/2019 06/19/18   Caccavale, Sophia, PA-C  pantoprazole (PROTONIX) 20 MG tablet Take 1 tablet (20 mg total) by mouth daily. Patient not taking: Reported on 06/19/2018 02/16/18   Nat Christen, MD  predniSONE (DELTASONE) 50 MG tablet Take 1 tablet (50 mg total) by mouth daily with breakfast. 04/13/19   Lawyer, Harrell Gave, PA-C  valACYclovir (VALTREX) 1000 MG tablet Take 1 tablet (1,000 mg total) by mouth 3 (three) times daily. 04/13/19   Lawyer, Harrell Gave, PA-C    Allergies    Aspirin, Caffeine,  and Sulfa antibiotics  Review of Systems   Review of Systems  Gastrointestinal: Positive for diarrhea. Negative for abdominal pain, constipation, nausea and vomiting.  Genitourinary: Negative for difficulty urinating, dysuria, frequency and urgency.  Musculoskeletal: Positive for back pain and myalgias.  Skin: Negative for color change and wound.  Neurological: Negative for weakness and numbness.   Physical Exam Updated Vital Signs BP (!) 137/99   Pulse 70   Temp 97.9 F (36.6 C) (Oral)   Resp 16   Ht 5\' 4"  (1.626 m)   Wt 64.4 kg   SpO2 100%   BMI 24.37 kg/m   Physical Exam Vitals and nursing note reviewed.  Constitutional:      General: She is not in acute distress.    Appearance: Normal appearance. She is normal weight. She is not ill-appearing, toxic-appearing or diaphoretic.  HENT:     Head: Normocephalic and atraumatic.     Right Ear: External ear normal.     Left Ear: External ear normal.     Nose: Nose normal.     Mouth/Throat:     Mouth: Mucous membranes are moist.  Eyes:     General: No scleral icterus.       Right eye: No discharge.        Left eye: No discharge.     Extraocular Movements: Extraocular movements intact.     Conjunctiva/sclera: Conjunctivae normal.     Pupils: Pupils are equal, round, and reactive to light.  Cardiovascular:     Rate and Rhythm: Normal rate and regular rhythm.     Pulses: Normal pulses.     Heart sounds: Normal heart sounds. No murmur. No friction rub. No gallop.   Pulmonary:     Effort: Pulmonary effort is normal. No respiratory distress.     Breath sounds: Normal breath sounds. No stridor. No wheezing, rhonchi or rales.  Chest:     Chest wall: No tenderness.  Abdominal:     General: Abdomen is flat.     Palpations: Abdomen is soft.     Tenderness: There is no abdominal tenderness.  Musculoskeletal:        General: Tenderness present. Normal range of motion.     Cervical back: Normal range of motion.     Comments:  Moderate TTP noted to the left lateral paraspinal musculature in the lumbar region.  No midline C, T, L-spine tenderness.  2+ patellar DTRs noted bilaterally.  Distal sensation intact in the bilateral lower extremities.  Strength is 5 out of 5 in  the bilateral lower extremities.  Skin:    Capillary Refill: Capillary refill takes less than 2 seconds.  Neurological:     General: No focal deficit present.     Mental Status: She is alert and oriented to person, place, and time.  Psychiatric:        Mood and Affect: Mood normal.        Behavior: Behavior normal.    ED Results / Procedures / Treatments   Labs (all labs ordered are listed, but only abnormal results are displayed) Labs Reviewed - No data to display  EKG None  Radiology No results found.  Procedures Procedures (including critical care time)  Medications Ordered in ED Medications - No data to display  ED Course  I have reviewed the triage vital signs and the nursing notes.  Pertinent labs & imaging results that were available during my care of the patient were reviewed by me and considered in my medical decision making (see chart for details).    MDM Rules/Calculators/A&P                      Patient is a 54 year old female that presents with left lumbar strain after heavy lifting at work 2 days ago.  Her physical exam is quite reassuring.  No midline lumbar pain.  Patient is neurovascularly intact in the lower extremities.  No saddle anesthesia.  Not concern for cauda equina at this time.  No radiating pain consistent with sciatica.  I discussed this with the patient and am going to discharge her on a short course of Flexeril.  I recommended she take both Tylenol and low doses of ibuprofen for management of her pain.  Continued use of ice and heat.  Range of motion exercises and exercise as tolerated.  Her questions were answered and she was amicable at the time of discharge.  Her blood pressure was initially elevated  and she notes that she recently ran out of her blood pressure medications but is working with her primary care to have these refilled.  I rechecked her blood pressure which was 142/92.  Otherwise, her vital signs are stable.  We will discharge the patient with strict PCP follow-up.  She understands to return to the emergency department with any new or worsening symptoms.  Patient discharged to home/self care.  Condition at discharge: Stable  Note: Portions of this report may have been transcribed using voice recognition software. Every effort was made to ensure accuracy; however, inadvertent computerized transcription errors may be present.    Final Clinical Impression(s) / ED Diagnoses Final diagnoses:  Strain of lumbar region, initial encounter    Rx / DC Orders ED Discharge Orders         Ordered    cyclobenzaprine (FLEXERIL) 10 MG tablet  Daily at bedtime     06/29/19 1226           Placido Sou, PA-C 06/29/19 1231    Lorre Nick, MD 06/29/19 1551

## 2019-08-11 ENCOUNTER — Encounter (HOSPITAL_COMMUNITY): Payer: Self-pay | Admitting: Emergency Medicine

## 2019-08-11 ENCOUNTER — Other Ambulatory Visit: Payer: Self-pay

## 2019-08-11 ENCOUNTER — Emergency Department (HOSPITAL_COMMUNITY)
Admission: EM | Admit: 2019-08-11 | Discharge: 2019-08-11 | Disposition: A | Discharge status: Home / Self Care | Payer: 59 | Attending: Emergency Medicine | Admitting: Emergency Medicine

## 2019-08-11 DIAGNOSIS — I10 Essential (primary) hypertension: Secondary | ICD-10-CM | POA: Diagnosis not present

## 2019-08-11 DIAGNOSIS — F17200 Nicotine dependence, unspecified, uncomplicated: Secondary | ICD-10-CM | POA: Diagnosis not present

## 2019-08-11 DIAGNOSIS — Z79899 Other long term (current) drug therapy: Secondary | ICD-10-CM | POA: Diagnosis not present

## 2019-08-11 DIAGNOSIS — H9203 Otalgia, bilateral: Secondary | ICD-10-CM | POA: Diagnosis present

## 2019-08-11 MED ORDER — AMOXICILLIN-POT CLAVULANATE 875-125 MG PO TABS: 1.0000 | ORAL_TABLET | Freq: Two times a day (BID) | ORAL | 0 refills | Status: DC

## 2019-08-11 NOTE — ED Triage Notes (Signed)
Per pt, states B/L ear pain and drainage for 3 days

## 2019-08-11 NOTE — ED Provider Notes (Signed)
[facility] DEPT Provider Note   CSN: [id number] Arrival date & time: 08/11/19  1600     History Chief Complaint  Patient presents with  . Otalgia    Robin Franco is a 54 y.o. female.  Presents to ER with concern for otalgia.  States that she has had long history of ear pain, earwax impaction.  Has been followed closely previously at the ear center in [place].  Has had recurrence of ear pain over the past couple days, pain in both ears.  No recent trauma, no recent fevers.  States that she uses a headset and earphones at work and this is particularly bothersome to her tonight and she was sent to ER from work for evaluation.  No hearing loss.  HPI     Past Medical History:  Diagnosis Date  . Bell's palsy   . G-6-PD deficiency   . GERD (gastroesophageal reflux disease)   . Hypertension   . IBS (irritable bowel syndrome)   . Ovarian cyst   . Thyroid disease    HYPER    Patient Active Problem List   Diagnosis Date Noted  . Acute on chronic cholecystitis 06/25/2018  . G-6-PD deficiency   . IBS (irritable bowel syndrome)   . Thyroid disease   . Bell's palsy   . GERD (gastroesophageal reflux disease)     Past Surgical History:  Procedure Laterality Date  . APPENDECTOMY    . CESAREAN SECTION    . FOOT SURGERY    . OVARIAN CYST REMOVAL    . TONSILLECTOMY    . TUBAL LIGATION       OB History    Gravida  2   Para  2   Term  2   Preterm  0   AB  0   Living  2     SAB  0   TAB  0   Ectopic  0   Multiple  0   Live Births              No family history on file.  Social History   Tobacco Use  . Smoking status: Current Every Day Smoker  . Smokeless tobacco: Never Used  Vaping Use  . Vaping Use: Never used  Substance Use Topics  . Alcohol use: Yes    Comment: occ  . Drug use: No    Home Medications Prior to Admission medications   Medication Sig Start Date End Date Taking? Authorizing Provider    acetaminophen (TYLENOL) 325 MG tablet Take 650 mg by mouth every 6 (six) hours as needed for mild pain.     [provider]  amLODipine (NORVASC) 5 MG tablet Take 5 mg by mouth daily. 06/15/18   [provider]  amoxicillin-clavulanate (AUGMENTIN) 875-125 MG tablet Take 1 tablet by mouth every 12 (twelve) hours. 08/11/19   Lucrezia Starch, MD  azithromycin (ZITHROMAX) 250 MG tablet Take 1 tablet (250 mg total) by mouth daily. Take first 2 tablets together, then 1 every day until finished. Patient not taking: Reported on 04/13/2019 07/10/17   Ashley Murrain, NP  benzonatate (TESSALON) 100 MG capsule Take 1 capsule (100 mg total) by mouth every 8 (eight) hours. Patient not taking: Reported on 09/11/2017 07/10/17   Ashley Murrain, NP  cyclobenzaprine (FLEXERIL) 10 MG tablet Take 1 tablet (10 mg total) by mouth at bedtime. 06/29/19   Rayna Sexton, PA-C  diphenhydrAMINE (BENADRYL) 25 MG tablet Take 1 tablet (25 mg total)  by mouth 3 (three) times daily. Take one tablet three times daily for two days Patient not taking: Reported on 02/16/2018 09/11/17   Gerhard Munch, MD  famotidine (PEPCID) 20 MG tablet Take 1 tablet (20 mg total) by mouth daily. Patient not taking: Reported on 04/13/2019 06/19/18   Caccavale, Sophia, PA-C  levothyroxine (SYNTHROID) 75 MCG tablet Take 1 tablet (75 mcg total) by mouth daily before breakfast. 02/16/18   Donnetta Hutching, MD  ondansetron (ZOFRAN) 4 MG tablet Take 1 tablet (4 mg total) by mouth every 8 (eight) hours as needed. Patient not taking: Reported on 04/13/2019 06/19/18   Caccavale, Sophia, PA-C  pantoprazole (PROTONIX) 20 MG tablet Take 1 tablet (20 mg total) by mouth daily. Patient not taking: Reported on 06/19/2018 02/16/18   Donnetta Hutching, MD  predniSONE (DELTASONE) 50 MG tablet Take 1 tablet (50 mg total) by mouth daily with breakfast. 04/13/19   Lawyer, Cristal Deer, PA-C  valACYclovir (VALTREX) 1000 MG tablet Take 1 tablet (1,000 mg total) by mouth 3 (three) times  daily. 04/13/19   Lawyer, Cristal Deer, PA-C    Allergies    Aspirin, Caffeine, and Sulfa antibiotics  Review of Systems   Review of Systems  Constitutional: Negative for chills and fever.  HENT: Positive for ear pain. Negative for sore throat.   Eyes: Negative for pain and visual disturbance.  Respiratory: Negative for cough and shortness of breath.   Cardiovascular: Negative for chest pain and palpitations.  Gastrointestinal: Negative for abdominal pain and vomiting.  Genitourinary: Negative for dysuria and hematuria.  Musculoskeletal: Negative for arthralgias and back pain.  Skin: Negative for color change and rash.  Neurological: Negative for seizures and syncope.  All other systems reviewed and are negative.   Physical Exam Updated Vital Signs BP 121/80 (BP Location: Left Arm)   Pulse 85   Temp 98.5 F (36.9 C) (Oral)   Resp 18   SpO2 100%   Physical Exam Vitals and nursing note reviewed.  Constitutional:      General: She is not in acute distress.    Appearance: She is well-developed.  HENT:     Head: Normocephalic and atraumatic.     Ears:     Comments: No tenderness palpation over mastoid process bilaterally, no swelling over mastoid or redness.  Right external ear canal has significant cerumen impaction, after irrigation, able to partially visualize TM, noted likely middle ear effusion  Left ear: TM normal no significant cerumen impaction Eyes:     Conjunctiva/sclera: Conjunctivae normal.  Cardiovascular:     Rate and Rhythm: Normal rate and regular rhythm.     Heart sounds: No murmur heard.   Pulmonary:     Effort: Pulmonary effort is normal. No respiratory distress.     Breath sounds: Normal breath sounds.  Abdominal:     Palpations: Abdomen is soft.     Tenderness: There is no abdominal tenderness.  Musculoskeletal:     Cervical back: Neck supple.  Skin:    General: Skin is warm and dry.  Neurological:     Mental Status: She is alert.     ED  Results / Procedures / Treatments   Labs (all labs ordered are listed, but only abnormal results are displayed) Labs Reviewed - No data to display  EKG None  Radiology No results found.  Procedures Procedures (including critical care time)  Medications Ordered in ED Medications - No data to display  ED Course  I have reviewed the triage vital signs and the nursing  notes.  Pertinent labs & imaging results that were available during my care of the patient were reviewed by me and considered in my medical decision making (see chart for details).    MDM Rules/Calculators/A&P                          54 year old lady presented to ER with concern for bilateral otalgia.  She is well-appearing and afebrile.  Right ear had significant cerumen impaction, had some improvement after irrigating at bedside and was able to partially visualize the TM.  Suspect there is a small effusion, will give course of antibiotics to cover possible infection.  No fever, no erythema, swelling over TM, well appearing overall. She is already established patient with ENT.  Recommend close recheck with ENT.    After the discussed management above, the patient was determined to be safe for discharge.  The patient was in agreement with this plan and all questions regarding their care were answered.  ED return precautions were discussed and the patient will return to the ED with any significant worsening of condition.   Final Clinical Impression(s) / ED Diagnoses Final diagnoses:  Otalgia of both ears    Rx / DC Orders ED Discharge Orders         Ordered    amoxicillin-clavulanate (AUGMENTIN) 875-125 MG tablet  Every 12 hours     Discontinue  Reprint     08/11/19 1927           Milagros Loll, MD 08/11/19 1933

## 2019-08-11 NOTE — Discharge Instructions (Signed)
Schedule follow-up appointment with your ENT doctor, ideally to be seen within the next couple days.  Take antibiotic as prescribed for possible infection.  If you have worsening pain, any fever, headaches or other new concerning symptom, return to ER for reassessment.

## 2019-08-29 DIAGNOSIS — R0683 Snoring: Secondary | ICD-10-CM | POA: Insufficient documentation

## 2019-09-28 ENCOUNTER — Other Ambulatory Visit: Payer: Self-pay

## 2019-09-28 ENCOUNTER — Emergency Department (HOSPITAL_COMMUNITY)
Admission: EM | Admit: 2019-09-28 | Discharge: 2019-09-28 | Disposition: A | Discharge status: Home / Self Care | Payer: 59 | Attending: Emergency Medicine | Admitting: Emergency Medicine

## 2019-09-28 ENCOUNTER — Encounter (HOSPITAL_COMMUNITY): Payer: Self-pay

## 2019-09-28 DIAGNOSIS — Z5321 Procedure and treatment not carried out due to patient leaving prior to being seen by health care provider: Secondary | ICD-10-CM | POA: Insufficient documentation

## 2019-09-28 DIAGNOSIS — N764 Abscess of vulva: Secondary | ICD-10-CM | POA: Diagnosis present

## 2019-09-28 DIAGNOSIS — L02811 Cutaneous abscess of head [any part, except face]: Secondary | ICD-10-CM | POA: Diagnosis not present

## 2019-09-28 DIAGNOSIS — L02411 Cutaneous abscess of right axilla: Secondary | ICD-10-CM | POA: Insufficient documentation

## 2019-09-28 NOTE — ED Triage Notes (Signed)
Pt reports abscesses to vaginal region,right armpit and right side of head. Pt denies hx of abscesses.

## 2019-11-30 ENCOUNTER — Encounter (HOSPITAL_COMMUNITY): Payer: Self-pay

## 2019-11-30 ENCOUNTER — Ambulatory Visit (HOSPITAL_COMMUNITY)
Admission: EM | Admit: 2019-11-30 | Discharge: 2019-11-30 | Disposition: A | Discharge status: Home / Self Care | Payer: 59 | Attending: Emergency Medicine | Admitting: Emergency Medicine

## 2019-11-30 ENCOUNTER — Other Ambulatory Visit: Payer: Self-pay

## 2019-11-30 DIAGNOSIS — R232 Flushing: Secondary | ICD-10-CM | POA: Insufficient documentation

## 2019-11-30 DIAGNOSIS — Z760 Encounter for issue of repeat prescription: Secondary | ICD-10-CM | POA: Diagnosis present

## 2019-11-30 DIAGNOSIS — L02419 Cutaneous abscess of limb, unspecified: Secondary | ICD-10-CM

## 2019-11-30 LAB — BASIC METABOLIC PANEL
Anion gap: 12 (ref 5–15)
BUN: 12 mg/dL (ref 6–20)
CO2: 20 mmol/L — ABNORMAL LOW (ref 22–32)
Calcium: 10.2 mg/dL (ref 8.9–10.3)
Chloride: 105 mmol/L (ref 98–111)
Creatinine, Ser: 0.71 mg/dL (ref 0.44–1.00)
GFR, Estimated: >60 mL/min (ref >60)
Glucose, Bld: 81 mg/dL (ref 70–99)
Potassium: 3.9 mmol/L (ref 3.5–5.1)
Sodium: 137 mmol/L (ref 135–145)

## 2019-11-30 MED ORDER — LEVOTHYROXINE SODIUM 75 MCG PO TABS: 75.0000 ug | ORAL_TABLET | Freq: Every day | ORAL | 0 refills | Status: DC

## 2019-11-30 MED ORDER — CEPHALEXIN 500 MG PO CAPS: 500.0000 mg | ORAL_CAPSULE | Freq: Four times a day (QID) | ORAL | 0 refills | Status: AC

## 2019-11-30 MED ORDER — AMLODIPINE BESYLATE 5 MG PO TABS: 5.0000 mg | ORAL_TABLET | Freq: Every day | ORAL | 0 refills | Status: DC

## 2019-11-30 NOTE — ED Triage Notes (Signed)
Pt presents today with cysts that have been appearing under her arms and on her head and neck. Has been having hot flashes as well. Has a hx of thyroid problems wants her thyroid check and medication refilled as well as her BP meds. She has an appointment with a new PCP on 11/18.

## 2019-11-30 NOTE — Discharge Instructions (Signed)
I have sent a weeks course of antibiotics for the abscess to your underarms and scalp.complete course.  Warm compresses may be helpful as well, drainage can help with resolution.  Please restart your medications. I would expect your thyroid level to be abnormal since you haven't taken this medication since June.  Please follow up with your primary care provider next month as you have scheduled, for follow up on your labs and medications.  Return for any worsening of symptoms.

## 2019-11-30 NOTE — ED Notes (Signed)
Pt refused a second attempted blood draw. Provider [place] ,[place] was informed Pt had to leave because she needed to jump her husband car

## 2019-11-30 NOTE — ED Notes (Signed)
PT reports to be difficult  Blood draw. Pt also reported she has not had any fluid intake or anything solid.

## 2019-11-30 NOTE — ED Provider Notes (Signed)
[facility]    CSN: [id number] Arrival date & time: 11/30/19  1132      History   Chief Complaint Chief Complaint  Patient presents with  . Cyst    HPI Robin Franco is a 54 y.o. female.   Robin Franco presents with multiple complaints. She has been out of medications since June, including synthroid and norvasc. States she has been taking black cohosh for hot flashes, also feels clammy intermittently. Also complaints of right axillary abscesses, have been bigger and painful in the past, but still currently present. Also has had small abscesses to scalp which have been painful. She has both pcp and gyn appointments next month, had lost her insurance but now has covereage. No chest pain , no headache, no swelling, no shortness of breath, no fevers. No drainage from abscesses. No palpitations.     ROS per HPI, negative if not otherwise mentioned.      Past Medical History:  Diagnosis Date  . Bell's palsy   . G-6-PD deficiency   . GERD (gastroesophageal reflux disease)   . Hypertension   . IBS (irritable bowel syndrome)   . Ovarian cyst   . Thyroid disease    HYPER    Patient Active Problem List   Diagnosis Date Noted  . Acute on chronic cholecystitis 06/25/2018  . G-6-PD deficiency   . IBS (irritable bowel syndrome)   . Thyroid disease   . Bell's palsy   . GERD (gastroesophageal reflux disease)     Past Surgical History:  Procedure Laterality Date  . APPENDECTOMY    . CESAREAN SECTION    . FOOT SURGERY    . OVARIAN CYST REMOVAL    . TONSILLECTOMY    . TUBAL LIGATION      OB History    Gravida  2   Para  2   Term  2   Preterm  0   AB  0   Living  2     SAB  0   TAB  0   Ectopic  0   Multiple  0   Live Births               Home Medications    Prior to Admission medications   Medication Sig Start Date End Date Taking? Authorizing Provider  acetaminophen (TYLENOL) 325 MG tablet Take 650 mg by mouth every 6 (six) hours  as needed for mild pain.    Yes [provider]  amLODipine (NORVASC) 5 MG tablet Take 1 tablet (5 mg total) by mouth daily. 11/30/19 12/30/19  Georgetta Haber, NP  amoxicillin-clavulanate (AUGMENTIN) 875-125 MG tablet Take 1 tablet by mouth every 12 (twelve) hours. 08/11/19   Milagros Loll, MD  azithromycin (ZITHROMAX) 250 MG tablet Take 1 tablet (250 mg total) by mouth daily. Take first 2 tablets together, then 1 every day until finished. Patient not taking: Reported on 04/13/2019 07/10/17   Janne Napoleon, NP  benzonatate (TESSALON) 100 MG capsule Take 1 capsule (100 mg total) by mouth every 8 (eight) hours. Patient not taking: Reported on 09/11/2017 07/10/17   Janne Napoleon, NP  cephALEXin (KEFLEX) 500 MG capsule Take 1 capsule (500 mg total) by mouth 4 (four) times daily for 7 days. 11/30/19 12/07/19  Georgetta Haber, NP  cyclobenzaprine (FLEXERIL) 10 MG tablet Take 1 tablet (10 mg total) by mouth at bedtime. 06/29/19   Placido Sou, PA-C  diphenhydrAMINE (BENADRYL) 25 MG tablet Take 1 tablet (25  mg total) by mouth 3 (three) times daily. Take one tablet three times daily for two days Patient not taking: Reported on 02/16/2018 09/11/17   Gerhard Munch, MD  famotidine (PEPCID) 20 MG tablet Take 1 tablet (20 mg total) by mouth daily. Patient not taking: Reported on 04/13/2019 06/19/18   Caccavale, Sophia, PA-C  levothyroxine (SYNTHROID) 75 MCG tablet Take 1 tablet (75 mcg total) by mouth daily before breakfast. 11/30/19   Georgetta Haber, NP  ondansetron (ZOFRAN) 4 MG tablet Take 1 tablet (4 mg total) by mouth every 8 (eight) hours as needed. Patient not taking: Reported on 04/13/2019 06/19/18   Caccavale, Sophia, PA-C  pantoprazole (PROTONIX) 20 MG tablet Take 1 tablet (20 mg total) by mouth daily. Patient not taking: Reported on 06/19/2018 02/16/18   Donnetta Hutching, MD  predniSONE (DELTASONE) 50 MG tablet Take 1 tablet (50 mg total) by mouth daily with breakfast. 04/13/19   Lawyer, Cristal Deer,  PA-C  valACYclovir (VALTREX) 1000 MG tablet Take 1 tablet (1,000 mg total) by mouth 3 (three) times daily. 04/13/19   Charlestine Night, PA-C    Family History History reviewed. No pertinent family history.  Social History Social History   Tobacco Use  . Smoking status: Current Every Day Smoker  . Smokeless tobacco: Never Used  Vaping Use  . Vaping Use: Never used  Substance Use Topics  . Alcohol use: Yes    Comment: occ  . Drug use: No     Allergies   Aspirin, Caffeine, and Sulfa antibiotics   Review of Systems Review of Systems   Physical Exam Triage Vital Signs ED Triage Vitals  Enc Vitals Group     BP 11/30/19 1409 130/78     Pulse Rate 11/30/19 1409 69     Resp 11/30/19 1409 16     Temp 11/30/19 1409 98.5 F (36.9 C)     Temp Source 11/30/19 1409 Oral     SpO2 11/30/19 1409 100 %     Weight --      Height --      Head Circumference --      Peak Flow --      Pain Score 11/30/19 1410 6     Pain Loc --      Pain Edu? --      Excl. in GC? --    No data found.  Updated Vital Signs BP 130/78 (BP Location: Left Arm)   Pulse 69   Temp 98.5 F (36.9 C) (Oral)   Resp 16   SpO2 100%   Visual Acuity Right Eye Distance:   Left Eye Distance:   Bilateral Distance:    Right Eye Near:   Left Eye Near:    Bilateral Near:     Physical Exam Constitutional:      General: She is not in acute distress.    Appearance: She is well-developed.  Cardiovascular:     Rate and Rhythm: Normal rate.  Pulmonary:     Effort: Pulmonary effort is normal.  Skin:    General: Skin is warm and dry.     Comments: Firm abscesses with mild tenderness to right axilla, no active drainage or surrounding redness, chronic appearing; pea sized healing scalp sore noted above left ear, no active drainage or redness  Neurological:     Mental Status: She is alert and oriented to person, place, and time.      UC Treatments / Results  Labs (all labs ordered are listed, but only  abnormal  results are displayed) Labs Reviewed  TSH  BASIC METABOLIC PANEL    EKG   Radiology No results found.  Procedures Procedures (including critical care time)  Medications Ordered in UC Medications - No data to display  Initial Impression / Assessment and Plan / UC Course  I have reviewed the triage vital signs and the nursing notes.  Pertinent labs & imaging results that were available during my care of the patient were reviewed by me and considered in my medical decision making (see chart for details).    Refill provided on synthroid and BP medication with bmp and tsh collected today. Emphasized importance in follow up with PCP for recheck, particularly with TSH. Keflex provided for multiple abscesses, no obvious indication for I&D today although discussed that could need this in the future, return precautions provided. Patient verbalized understanding and agreeable to plan.   Final Clinical Impressions(s) / UC Diagnoses   Final diagnoses:  Hot flashes  Axillary abscess  Encounter for medication refill     Discharge Instructions     I have sent a weeks course of antibiotics for the abscess to your underarms and scalp.complete course.  Warm compresses may be helpful as well, drainage can help with resolution.  Please restart your medications. I would expect your thyroid level to be abnormal since you haven't taken this medication since June.  Please follow up with your primary care provider next month as you have scheduled, for follow up on your labs and medications.  Return for any worsening of symptoms.    ED Prescriptions    Medication Sig Dispense Auth. Provider   amLODipine (NORVASC) 5 MG tablet Take 1 tablet (5 mg total) by mouth daily. 30 tablet Linus Mako B, NP   levothyroxine (SYNTHROID) 75 MCG tablet  (Status: Discontinued) Take 1 tablet (75 mcg total) by mouth daily before breakfast. 30 tablet Linus Mako B, NP   levothyroxine (SYNTHROID) 75  MCG tablet Take 1 tablet (75 mcg total) by mouth daily before breakfast. 30 tablet Pearl Bents B, NP   cephALEXin (KEFLEX) 500 MG capsule Take 1 capsule (500 mg total) by mouth 4 (four) times daily for 7 days. 28 capsule Georgetta Haber, NP     PDMP not reviewed this encounter.   Georgetta Haber, NP 11/30/19 2120

## 2020-05-18 ENCOUNTER — Encounter (HOSPITAL_COMMUNITY): Payer: Self-pay

## 2020-05-18 ENCOUNTER — Ambulatory Visit: Payer: Self-pay

## 2020-05-18 ENCOUNTER — Ambulatory Visit (HOSPITAL_COMMUNITY)
Admission: EM | Admit: 2020-05-18 | Discharge: 2020-05-18 | Disposition: A | Discharge status: Home / Self Care | Payer: 59

## 2020-05-18 ENCOUNTER — Other Ambulatory Visit: Payer: Self-pay

## 2020-05-18 DIAGNOSIS — T300 Burn of unspecified body region, unspecified degree: Secondary | ICD-10-CM

## 2020-05-18 DIAGNOSIS — T22112A Burn of first degree of left forearm, initial encounter: Secondary | ICD-10-CM

## 2020-05-18 HISTORY — DX: Other seasonal allergic rhinitis: J30.2

## 2020-05-18 MED ORDER — MUPIROCIN 2 % EX OINT: 1.0000 "application " | TOPICAL_OINTMENT | Freq: Two times a day (BID) | CUTANEOUS | 0 refills | Status: DC

## 2020-05-18 NOTE — ED Provider Notes (Signed)
[facility]    CSN: [id number] Arrival date & time: 05/18/20  0855      History   Chief Complaint Chief Complaint  Patient presents with  . Burn    HPI Robin Franco is a 55 y.o. female.   Ms. Robin Franco presents today after having hot coffee spilled on her at the local [employer] on Saturday (05/16/2020). She reports this hit her left forearm only and caused erythema and blistering. She has had persistent pain since that time which is currently rated 7 on a 0-10 pain scale, described as burning, worse with movement, no alleviating factors identified. She has tried an over the counter burn cream without improvement. She has had difficulty with her activities including job duties as a result of pain. Area is localized and not circumferential. She denies any decreased range of motion, fever, bleeding, drainage.       Past Medical History:  Diagnosis Date  . Bell's palsy   . G-6-PD deficiency   . GERD (gastroesophageal reflux disease)   . Hypertension   . IBS (irritable bowel syndrome)   . Ovarian cyst   . Seasonal allergies   . Thyroid disease    HYPER    Patient Active Problem List   Diagnosis Date Noted  . Acute on chronic cholecystitis 06/25/2018  . G-6-PD deficiency   . IBS (irritable bowel syndrome)   . Thyroid disease   . Bell's palsy   . GERD (gastroesophageal reflux disease)     Past Surgical History:  Procedure Laterality Date  . APPENDECTOMY    . CESAREAN SECTION    . FOOT SURGERY    . OVARIAN CYST REMOVAL    . TONSILLECTOMY    . TUBAL LIGATION      OB History    Gravida  2   Para  2   Term  2   Preterm  0   AB  0   Living  2     SAB  0   IAB  0   Ectopic  0   Multiple  0   Live Births               Home Medications    Prior to Admission medications   Medication Sig Start Date End Date Taking? Authorizing Provider  mupirocin ointment (BACTROBAN) 2 % Apply 1 application topically 2 (two) times daily. 05/18/20  Yes  Christien Frankl, Noberto Retort, PA-C  acetaminophen (TYLENOL) 325 MG tablet Take 650 mg by mouth every 6 (six) hours as needed for mild pain.     [provider]  amLODipine (NORVASC) 5 MG tablet Take 1 tablet (5 mg total) by mouth daily. 11/30/19 12/30/19  Georgetta Haber, NP  amoxicillin-clavulanate (AUGMENTIN) 875-125 MG tablet Take 1 tablet by mouth every 12 (twelve) hours. 08/11/19   Milagros Loll, MD  azithromycin (ZITHROMAX) 250 MG tablet Take 1 tablet (250 mg total) by mouth daily. Take first 2 tablets together, then 1 every day until finished. Patient not taking: Reported on 04/13/2019 07/10/17   Janne Napoleon, NP  benzonatate (TESSALON) 100 MG capsule Take 1 capsule (100 mg total) by mouth every 8 (eight) hours. Patient not taking: Reported on 09/11/2017 07/10/17   Janne Napoleon, NP  cyclobenzaprine (FLEXERIL) 10 MG tablet Take 1 tablet (10 mg total) by mouth at bedtime. 06/29/19   Placido Sou, PA-C  diphenhydrAMINE (BENADRYL) 25 MG tablet Take 1 tablet (25 mg total) by mouth 3 (three) times daily. Take  one tablet three times daily for two days Patient not taking: Reported on 02/16/2018 09/11/17   Gerhard Munch, MD  estradiol (ESTRACE) 1 MG tablet Take 1 mg by mouth daily. 05/01/20   [provider]  famotidine (PEPCID) 20 MG tablet Take 1 tablet (20 mg total) by mouth daily. Patient not taking: Reported on 04/13/2019 06/19/18   Caccavale, Sophia, PA-C  levothyroxine (SYNTHROID) 75 MCG tablet Take 1 tablet (75 mcg total) by mouth daily before breakfast. 11/30/19   Georgetta Haber, NP  ondansetron (ZOFRAN) 4 MG tablet Take 1 tablet (4 mg total) by mouth every 8 (eight) hours as needed. Patient not taking: Reported on 04/13/2019 06/19/18   Caccavale, Sophia, PA-C  pantoprazole (PROTONIX) 20 MG tablet Take 1 tablet (20 mg total) by mouth daily. Patient not taking: Reported on 06/19/2018 02/16/18   Donnetta Hutching, MD  predniSONE (DELTASONE) 50 MG tablet Take 1 tablet (50 mg total) by mouth daily  with breakfast. 04/13/19   Lawyer, Cristal Deer, PA-C  valACYclovir (VALTREX) 1000 MG tablet Take 1 tablet (1,000 mg total) by mouth 3 (three) times daily. 04/13/19   Charlestine Night, PA-C    Family History Family History  Problem Relation Age of Onset  . Healthy Mother   . Healthy Father     Social History Social History   Tobacco Use  . Smoking status: Former Games developer  . Smokeless tobacco: Never Used  Vaping Use  . Vaping Use: Never used  Substance Use Topics  . Alcohol use: Yes    Comment: occ  . Drug use: No     Allergies   Aspirin, Caffeine, and Sulfa antibiotics   Review of Systems Review of Systems  Constitutional: Positive for activity change. Negative for appetite change, fatigue and fever.  Musculoskeletal: Negative for arthralgias and joint swelling.  Skin: Positive for color change and wound.  Neurological: Negative for dizziness, weakness, light-headedness, numbness and headaches.     Physical Exam Triage Vital Signs ED Triage Vitals  Enc Vitals Group     BP 05/18/20 0911 (!) 160/93     Pulse Rate 05/18/20 0911 86     Resp 05/18/20 0911 18     Temp 05/18/20 0911 97.9 F (36.6 C)     Temp src --      SpO2 05/18/20 0911 97 %     Weight --      Height --      Head Circumference --      Peak Flow --      Pain Score 05/18/20 0910 7     Pain Loc --      Pain Edu? --      Excl. in GC? --    No data found.  Updated Vital Signs BP (!) 160/93   Pulse 86   Temp 97.9 F (36.6 C)   Resp 18   SpO2 97%   Visual Acuity Right Eye Distance:   Left Eye Distance:   Bilateral Distance:    Right Eye Near:   Left Eye Near:    Bilateral Near:     Physical Exam Vitals reviewed.  Constitutional:      General: She is awake. She is not in acute distress.    Appearance: Normal appearance. She is not ill-appearing.     Comments: Very pleasant female appears stated age in no acute distress  HENT:     Head: Normocephalic and atraumatic.   Cardiovascular:     Rate and Rhythm: Normal rate and regular  rhythm.     Heart sounds: No murmur heard.   Pulmonary:     Effort: Pulmonary effort is normal.     Breath sounds: Normal breath sounds. No wheezing, rhonchi or rales.     Comments: Clear to auscultation bilaterally  Musculoskeletal:     Right lower leg: No edema.     Left lower leg: No edema.     Comments: Left arm: Normal active range of motion of hand, wrist, elbow. Hand neurovascularly intact.   Skin:    Findings: Burn and wound present. No erythema.     Comments: 5cm x 2cm 1st degree burn left forearm. No bleeding or drainage noted. No surrounding erythema or streaking. Small ulcer noted at proximal wound consistent with second degree burn.   Psychiatric:        Behavior: Behavior is cooperative.      UC Treatments / Results  Labs (all labs ordered are listed, but only abnormal results are displayed) Labs Reviewed - No data to display  EKG   Radiology No results found.  Procedures Procedures (including critical care time)  Medications Ordered in UC Medications - No data to display  Initial Impression / Assessment and Plan / UC Course  I have reviewed the triage vital signs and the nursing notes.  Pertinent labs & imaging results that were available during my care of the patient were reviewed by me and considered in my medical decision making (see chart for details).      No evidence of infection that would warrant systemic antibiotics. Wound appears to be healing appropriately and patient was encouraged to keep area clean with warm water and soap. She was prescribed Bactroban to apply during dressing changes. She can use over the counter medicine for pain relief. She was provided work excuse note with anticipated return to work 05/19/2020. Reviewed signs/symptoms of infection that would warrant reevaluation. Strict return precautions given to which patient expressed understanding.   Final Clinical  Impressions(s) / UC Diagnoses   Final diagnoses:  Thermal burn     Discharge Instructions     Use ointment with dressing changes. You can use over the counter medication for symptoms. If anything worsens please come see [place] again.     ED Prescriptions    Medication Sig Dispense Auth. Provider   mupirocin ointment (BACTROBAN) 2 % Apply 1 application topically 2 (two) times daily. 22 g Lennix Kneisel K, PA-C     PDMP not reviewed this encounter.   Jeani Hawking, PA-C 05/18/20 0973

## 2020-05-18 NOTE — Discharge Instructions (Signed)
Use ointment with dressing changes. You can use over the counter medication for symptoms. If anything worsens please come see [place] again.

## 2020-05-18 NOTE — ED Notes (Signed)
Pt states she is taking a blood pressure and a thyroid medication, but RN unable to document these in triage d/t pt not remembering the names of these.

## 2020-05-18 NOTE — ED Triage Notes (Signed)
[place] and coffee was spilled on her arm Saturday. Small blistered area noted to left forearm.

## 2020-07-17 ENCOUNTER — Other Ambulatory Visit: Payer: Self-pay | Admitting: Ophthalmology

## 2020-07-17 DIAGNOSIS — H5789 Other specified disorders of eye and adnexa: Secondary | ICD-10-CM

## 2020-07-17 DIAGNOSIS — H0520 Unspecified exophthalmos: Secondary | ICD-10-CM

## 2020-07-17 DIAGNOSIS — H532 Diplopia: Secondary | ICD-10-CM

## 2020-12-02 ENCOUNTER — Other Ambulatory Visit: Payer: Self-pay

## 2020-12-02 ENCOUNTER — Encounter (HOSPITAL_COMMUNITY): Payer: Self-pay

## 2020-12-02 ENCOUNTER — Ambulatory Visit (HOSPITAL_COMMUNITY)
Admission: EM | Admit: 2020-12-02 | Discharge: 2020-12-02 | Disposition: A | Discharge status: Home / Self Care | Payer: 59 | Attending: Emergency Medicine | Admitting: Emergency Medicine

## 2020-12-02 DIAGNOSIS — R350 Frequency of micturition: Secondary | ICD-10-CM | POA: Insufficient documentation

## 2020-12-02 DIAGNOSIS — K529 Noninfective gastroenteritis and colitis, unspecified: Secondary | ICD-10-CM | POA: Insufficient documentation

## 2020-12-02 LAB — POCT URINALYSIS DIPSTICK, ED / UC
Bilirubin Urine: NEGATIVE
Glucose, UA: NEGATIVE mg/dL
Leukocytes,Ua: NEGATIVE
Nitrite: NEGATIVE
Protein, ur: NEGATIVE mg/dL
Specific Gravity, Urine: >=1.03 (ref 1.005–1.030)
Urobilinogen, UA: 0.2 mg/dL (ref 0.0–1.0)
pH: 6 (ref 5.0–8.0)

## 2020-12-02 MED ORDER — AMOXICILLIN-POT CLAVULANATE 875-125 MG PO TABS: 1.0000 | ORAL_TABLET | Freq: Two times a day (BID) | ORAL | 0 refills | Status: AC

## 2020-12-02 NOTE — ED Triage Notes (Signed)
Patient states she is unable to give a Urine Specimen at this time. Patient has a drink in the room.

## 2020-12-02 NOTE — ED Provider Notes (Signed)
[facility]    CSN: [id number] Arrival date & time: 12/02/20  1322      History   Chief Complaint Chief Complaint  Patient presents with   Urinary Frequency   Diarrhea    HPI Robin Franco is a 55 y.o. female.   Patient here for evaluation of diarrhea and urinary frequency that has been ongoing for the past month.  Reports urinating approximately every 30 minutes especially after drinking.  Reports multiple episodes of watery diarrhea every day.  Reports lower abdominal and left-sided pain and cramping as well as some lower back pain.  Reports symptoms are worse after eating or drinking.  Has not taken any OTC medications or treatments.  Denies any trauma, injury, or other precipitating event.  Denies any specific alleviating or aggravating factors.  Denies any fevers, chest pain, shortness of breath, numbness, tingling, weakness, or headaches.    The history is provided by the patient.  Urinary Frequency  Diarrhea Associated symptoms: no vomiting    Past Medical History:  Diagnosis Date   Bell's palsy    G-6-PD deficiency    GERD (gastroesophageal reflux disease)    Hypertension    IBS (irritable bowel syndrome)    Ovarian cyst    Seasonal allergies    Thyroid disease    HYPER    Patient Active Problem List   Diagnosis Date Noted   Acute on chronic cholecystitis 06/25/2018   G-6-PD deficiency    IBS (irritable bowel syndrome)    Thyroid disease    Bell's palsy    GERD (gastroesophageal reflux disease)     Past Surgical History:  Procedure Laterality Date   APPENDECTOMY     CESAREAN SECTION     FOOT SURGERY     OVARIAN CYST REMOVAL     TONSILLECTOMY     TUBAL LIGATION      OB History     Gravida  2   Para  2   Term  2   Preterm  0   AB  0   Living  2      SAB  0   IAB  0   Ectopic  0   Multiple  0   Live Births               Home Medications    Prior to Admission medications   Medication Sig Start Date End Date  Taking? Authorizing Provider  amoxicillin-clavulanate (AUGMENTIN) 875-125 MG tablet Take 1 tablet by mouth every 12 (twelve) hours for 10 days. 12/02/20 12/12/20 Yes Ivette Loyal, NP  acetaminophen (TYLENOL) 325 MG tablet Take 650 mg by mouth every 6 (six) hours as needed for mild pain.     [provider]  amLODipine (NORVASC) 5 MG tablet Take 1 tablet (5 mg total) by mouth daily. 11/30/19 12/30/19  Georgetta Haber, NP  azithromycin (ZITHROMAX) 250 MG tablet Take 1 tablet (250 mg total) by mouth daily. Take first 2 tablets together, then 1 every day until finished. Patient not taking: Reported on 04/13/2019 07/10/17   Janne Napoleon, NP  benzonatate (TESSALON) 100 MG capsule Take 1 capsule (100 mg total) by mouth every 8 (eight) hours. Patient not taking: Reported on 09/11/2017 07/10/17   Janne Napoleon, NP  cyclobenzaprine (FLEXERIL) 10 MG tablet Take 1 tablet (10 mg total) by mouth at bedtime. 06/29/19   Placido Sou, PA-C  diphenhydrAMINE (BENADRYL) 25 MG tablet Take 1 tablet (25 mg total) by mouth 3 (three)  times daily. Take one tablet three times daily for two days Patient not taking: Reported on 02/16/2018 09/11/17   Gerhard Munch, MD  estradiol (ESTRACE) 1 MG tablet Take 1 mg by mouth daily. 05/01/20   [provider]  famotidine (PEPCID) 20 MG tablet Take 1 tablet (20 mg total) by mouth daily. Patient not taking: Reported on 04/13/2019 06/19/18   Caccavale, Sophia, PA-C  levothyroxine (SYNTHROID) 75 MCG tablet Take 1 tablet (75 mcg total) by mouth daily before breakfast. 11/30/19   Georgetta Haber, NP  mupirocin ointment (BACTROBAN) 2 % Apply 1 application topically 2 (two) times daily. 05/18/20   Raspet, Erin K, PA-C  ondansetron (ZOFRAN) 4 MG tablet Take 1 tablet (4 mg total) by mouth every 8 (eight) hours as needed. Patient not taking: Reported on 04/13/2019 06/19/18   Caccavale, Sophia, PA-C  pantoprazole (PROTONIX) 20 MG tablet Take 1 tablet (20 mg total) by mouth  daily. Patient not taking: Reported on 06/19/2018 02/16/18   Donnetta Hutching, MD  predniSONE (DELTASONE) 50 MG tablet Take 1 tablet (50 mg total) by mouth daily with breakfast. 04/13/19   Lawyer, Cristal Deer, PA-C  valACYclovir (VALTREX) 1000 MG tablet Take 1 tablet (1,000 mg total) by mouth 3 (three) times daily. 04/13/19   Lawyer, Cristal Deer, PA-C    Family History Family History  Problem Relation Age of Onset   Healthy Mother    Healthy Father     Social History Social History   Tobacco Use   Smoking status: Former   Smokeless tobacco: Never  Building services engineer Use: Never used  Substance Use Topics   Alcohol use: Yes    Comment: occ   Drug use: No     Allergies   Aspirin, Caffeine, and Sulfa antibiotics   Review of Systems Review of Systems  Gastrointestinal:  Positive for diarrhea and nausea. Negative for blood in stool and vomiting.  Genitourinary:  Positive for frequency and urgency.  All other systems reviewed and are negative.   Physical Exam Triage Vital Signs ED Triage Vitals  Enc Vitals Group     BP 12/02/20 1522 (!) 163/95     Pulse Rate 12/02/20 1522 76     Resp 12/02/20 1522 18     Temp 12/02/20 1522 98.4 F (36.9 C)     Temp Source 12/02/20 1522 Oral     SpO2 12/02/20 1522 98 %     Weight --      Height --      Head Circumference --      Peak Flow --      Pain Score 12/02/20 1524 5     Pain Loc --      Pain Edu? --      Excl. in GC? --    No data found.  Updated Vital Signs BP (!) 163/95 (BP Location: Right Arm)   Pulse 76   Temp 98.4 F (36.9 C) (Oral)   Resp 18   SpO2 98%   Visual Acuity Right Eye Distance:   Left Eye Distance:   Bilateral Distance:    Right Eye Near:   Left Eye Near:    Bilateral Near:     Physical Exam Vitals and nursing note reviewed.  Constitutional:      General: She is not in acute distress.    Appearance: Normal appearance. She is not ill-appearing, toxic-appearing or diaphoretic.  HENT:     Head:  Normocephalic and atraumatic.  Eyes:     Conjunctiva/sclera:  Conjunctivae normal.  Cardiovascular:     Rate and Rhythm: Normal rate.     Pulses: Normal pulses.     Heart sounds: Normal heart sounds.  Pulmonary:     Effort: Pulmonary effort is normal.     Breath sounds: Normal breath sounds.  Abdominal:     General: Abdomen is flat. There is no distension.     Tenderness: There is no right CVA tenderness, left CVA tenderness or guarding.  Genitourinary:    Comments: declines Musculoskeletal:        General: Normal range of motion.     Cervical back: Normal range of motion.  Skin:    General: Skin is warm and dry.  Neurological:     General: No focal deficit present.     Mental Status: She is alert and oriented to person, place, and time.  Psychiatric:        Mood and Affect: Mood normal.     UC Treatments / Results  Labs (all labs ordered are listed, but only abnormal results are displayed) Labs Reviewed  POCT URINALYSIS DIPSTICK, ED / UC - Abnormal; Notable for the following components:      Result Value   Ketones, ur TRACE (*)    Hgb urine dipstick LARGE (*)    All other components within normal limits  URINE CULTURE    EKG   Radiology No results found.  Procedures Procedures (including critical care time)  Medications Ordered in UC Medications - No data to display  Initial Impression / Assessment and Plan / UC Course  I have reviewed the triage vital signs and the nursing notes.  Pertinent labs & imaging results that were available during my care of the patient were reviewed by me and considered in my medical decision making (see chart for details).    Assessment negative for red flags or concerns.  Urinalysis positive for ketones and hemoglobin.  Will obtain urine culture.  Diarrhea and left-sided abdominal pain likely colitis with possible diverticulitis.  Will treat with Augmentin twice a day for the next 10 days.  Encourage fluids and rest.  May try AZO  to help with urinary frequency.  Follow-up with primary care as soon as possible for reevaluation.  Strict ED follow-up for any worsening symptoms. Final Clinical Impressions(s) / UC Diagnoses   Final diagnoses:  Colitis  Urinary frequency     Discharge Instructions      Take the Augmentin twice a day or the next 10 days.   You can take Tylenol and/or Ibuprofen as needed for pain relief and fever reduction.   Make sure you are drinking plenty of fluids, especially water.  You can drink cranberry juice to help with symptom relief, but make sure it is cranberry juice and not cranberry cocktail.  You can also try AZO, cranberry pills, or pyridium as needed.    Return or go to the Emergency Department if symptoms worsen or do not improve in the next few days.  Follow up with your primary care provider for re-evaluation as soon as possible.      ED Prescriptions     Medication Sig Dispense Auth. Provider   amoxicillin-clavulanate (AUGMENTIN) 875-125 MG tablet Take 1 tablet by mouth every 12 (twelve) hours for 10 days. 20 tablet Ivette Loyal, NP      PDMP not reviewed this encounter.   Ivette Loyal, NP 12/02/20 [phone]

## 2020-12-02 NOTE — ED Triage Notes (Signed)
Pt presents with diarrhea and urinary frequency X 1 month.

## 2020-12-02 NOTE — Discharge Instructions (Addendum)
Take the Augmentin twice a day or the next 10 days.   You can take Tylenol and/or Ibuprofen as needed for pain relief and fever reduction.   Make sure you are drinking plenty of fluids, especially water.  You can drink cranberry juice to help with symptom relief, but make sure it is cranberry juice and not cranberry cocktail.  You can also try AZO, cranberry pills, or pyridium as needed.    Return or go to the Emergency Department if symptoms worsen or do not improve in the next few days.  Follow up with your primary care provider for re-evaluation as soon as possible.

## 2020-12-04 LAB — URINE CULTURE

## 2020-12-05 ENCOUNTER — Emergency Department (HOSPITAL_COMMUNITY): Payer: Self-pay

## 2020-12-05 ENCOUNTER — Emergency Department (HOSPITAL_COMMUNITY)
Admission: EM | Admit: 2020-12-05 | Discharge: 2020-12-05 | Disposition: A | Discharge status: Home / Self Care | Payer: Self-pay | Attending: Emergency Medicine | Admitting: Emergency Medicine

## 2020-12-05 ENCOUNTER — Encounter (HOSPITAL_COMMUNITY): Payer: Self-pay

## 2020-12-05 ENCOUNTER — Other Ambulatory Visit: Payer: Self-pay

## 2020-12-05 DIAGNOSIS — Z87891 Personal history of nicotine dependence: Secondary | ICD-10-CM | POA: Insufficient documentation

## 2020-12-05 DIAGNOSIS — Z79899 Other long term (current) drug therapy: Secondary | ICD-10-CM | POA: Insufficient documentation

## 2020-12-05 DIAGNOSIS — R319 Hematuria, unspecified: Secondary | ICD-10-CM | POA: Insufficient documentation

## 2020-12-05 DIAGNOSIS — K219 Gastro-esophageal reflux disease without esophagitis: Secondary | ICD-10-CM | POA: Insufficient documentation

## 2020-12-05 DIAGNOSIS — R197 Diarrhea, unspecified: Secondary | ICD-10-CM | POA: Insufficient documentation

## 2020-12-05 DIAGNOSIS — I10 Essential (primary) hypertension: Secondary | ICD-10-CM | POA: Insufficient documentation

## 2020-12-05 DIAGNOSIS — R1030 Lower abdominal pain, unspecified: Secondary | ICD-10-CM | POA: Insufficient documentation

## 2020-12-05 DIAGNOSIS — M545 Low back pain, unspecified: Secondary | ICD-10-CM | POA: Insufficient documentation

## 2020-12-05 LAB — CBC WITH DIFFERENTIAL/PLATELET
Abs Immature Granulocytes: 0.02 10*3/uL (ref 0.00–0.07)
Basophils Absolute: 0.1 10*3/uL (ref 0.0–0.1)
Basophils Relative: 1 %
Eosinophils Absolute: 0.3 10*3/uL (ref 0.0–0.5)
Eosinophils Relative: 4 %
HCT: 43.2 % (ref 36.0–46.0)
Hemoglobin: 13.2 g/dL (ref 12.0–15.0)
Immature Granulocytes: 0 %
Lymphocytes Relative: 36 %
Lymphs Abs: 2.6 10*3/uL (ref 0.7–4.0)
MCH: 25.2 pg — ABNORMAL LOW (ref 26.0–34.0)
MCHC: 30.6 g/dL (ref 30.0–36.0)
MCV: 82.6 fL (ref 80.0–100.0)
Monocytes Absolute: 0.6 10*3/uL (ref 0.1–1.0)
Monocytes Relative: 9 %
Neutro Abs: 3.6 10*3/uL (ref 1.7–7.7)
Neutrophils Relative %: 50 %
Platelets: 335 10*3/uL (ref 150–400)
RBC: 5.23 MIL/uL — ABNORMAL HIGH (ref 3.87–5.11)
RDW: 14.6 % (ref 11.5–15.5)
WBC: 7.2 10*3/uL (ref 4.0–10.5)
nRBC: 0 % (ref 0.0–0.2)

## 2020-12-05 LAB — BASIC METABOLIC PANEL
Anion gap: 4 — ABNORMAL LOW (ref 5–15)
BUN: 19 mg/dL (ref 6–20)
CO2: 28 mmol/L (ref 22–32)
Calcium: 10.1 mg/dL (ref 8.9–10.3)
Chloride: 104 mmol/L (ref 98–111)
Creatinine, Ser: 0.76 mg/dL (ref 0.44–1.00)
GFR, Estimated: >60 mL/min (ref >60)
Glucose, Bld: 113 mg/dL — ABNORMAL HIGH (ref 70–99)
Potassium: 4.2 mmol/L (ref 3.5–5.1)
Sodium: 136 mmol/L (ref 135–145)

## 2020-12-05 LAB — URINALYSIS, ROUTINE W REFLEX MICROSCOPIC
Bacteria, UA: NONE SEEN
Bilirubin Urine: NEGATIVE
Glucose, UA: NEGATIVE mg/dL
Ketones, ur: NEGATIVE mg/dL
Leukocytes,Ua: NEGATIVE
Nitrite: NEGATIVE
Protein, ur: NEGATIVE mg/dL
Specific Gravity, Urine: 1.025 (ref 1.005–1.030)
pH: 5 (ref 5.0–8.0)

## 2020-12-05 NOTE — ED Triage Notes (Signed)
Patient arrives from home with complaint of inability to control her bladder for months. Pt reports urinary frequency, hematuria, diarrhea, pelvic and back pain.

## 2020-12-05 NOTE — Discharge Instructions (Addendum)
Follow up with [facility] for further evaluation of urinary frequency and new finding of blood in the urine, and with [facility] for further evaluation of chronic diarrhea.

## 2020-12-05 NOTE — ED Notes (Signed)
Patient seen at [facility] and placed on Augmentin 10/19.

## 2020-12-05 NOTE — ED Provider Notes (Signed)
[facility]-EMERGENCY DEPT Provider Note   CSN: [id number] Arrival date & time: 12/05/20  0448     History Chief Complaint  Patient presents with   Hematuria    Robin Franco is a 55 y.o. female.  Patient to ED with concern for ongoing/worsening symptoms of urinary frequency for months. She denies dysuria, fever. She also has chronic diarrhea with history of IBS. Seen at Urgent Care 10/19 for same and started on Augmentin which she reports she started yesterday. She has associated abdominal discomfort across lower abdomen and low back.   The history is provided by the patient. No language interpreter was used.  Hematuria Associated symptoms include abdominal pain.      Past Medical History:  Diagnosis Date   Bell's palsy    G-6-PD deficiency    GERD (gastroesophageal reflux disease)    Hypertension    IBS (irritable bowel syndrome)    Ovarian cyst    Seasonal allergies    Thyroid disease    HYPER    Patient Active Problem List   Diagnosis Date Noted   Acute on chronic cholecystitis 06/25/2018   G-6-PD deficiency    IBS (irritable bowel syndrome)    Thyroid disease    Bell's palsy    GERD (gastroesophageal reflux disease)     Past Surgical History:  Procedure Laterality Date   APPENDECTOMY     CESAREAN SECTION     CHOLECYSTECTOMY  05/31/2018   FOOT SURGERY     OVARIAN CYST REMOVAL     TONSILLECTOMY     TUBAL LIGATION       OB History     Gravida  2   Para  2   Term  2   Preterm  0   AB  0   Living  2      SAB  0   IAB  0   Ectopic  0   Multiple  0   Live Births              Family History  Problem Relation Age of Onset   Healthy Mother    Healthy Father     Social History   Tobacco Use   Smoking status: Former   Smokeless tobacco: Never  Building services engineer Use: Never used  Substance Use Topics   Alcohol use: Yes    Comment: occ   Drug use: No    Home Medications Prior to Admission  medications   Medication Sig Start Date End Date Taking? Authorizing Provider  acetaminophen (TYLENOL) 325 MG tablet Take 650 mg by mouth every 6 (six) hours as needed for mild pain.     [provider]  amLODipine (NORVASC) 5 MG tablet Take 1 tablet (5 mg total) by mouth daily. 11/30/19 12/30/19  Georgetta Haber, NP  amoxicillin-clavulanate (AUGMENTIN) 875-125 MG tablet Take 1 tablet by mouth every 12 (twelve) hours for 10 days. 12/02/20 12/12/20  Ivette Loyal, NP  azithromycin (ZITHROMAX) 250 MG tablet Take 1 tablet (250 mg total) by mouth daily. Take first 2 tablets together, then 1 every day until finished. Patient not taking: Reported on 04/13/2019 07/10/17   Janne Napoleon, NP  benzonatate (TESSALON) 100 MG capsule Take 1 capsule (100 mg total) by mouth every 8 (eight) hours. Patient not taking: Reported on 09/11/2017 07/10/17   Janne Napoleon, NP  cyclobenzaprine (FLEXERIL) 10 MG tablet Take 1 tablet (10 mg total) by mouth at bedtime. 06/29/19  Placido Sou, PA-C  diphenhydrAMINE (BENADRYL) 25 MG tablet Take 1 tablet (25 mg total) by mouth 3 (three) times daily. Take one tablet three times daily for two days Patient not taking: Reported on 02/16/2018 09/11/17   Gerhard Munch, MD  estradiol (ESTRACE) 1 MG tablet Take 1 mg by mouth daily. 05/01/20   [provider]  famotidine (PEPCID) 20 MG tablet Take 1 tablet (20 mg total) by mouth daily. Patient not taking: Reported on 04/13/2019 06/19/18   Caccavale, Sophia, PA-C  levothyroxine (SYNTHROID) 75 MCG tablet Take 1 tablet (75 mcg total) by mouth daily before breakfast. 11/30/19   Georgetta Haber, NP  mupirocin ointment (BACTROBAN) 2 % Apply 1 application topically 2 (two) times daily. 05/18/20   Raspet, Erin K, PA-C  ondansetron (ZOFRAN) 4 MG tablet Take 1 tablet (4 mg total) by mouth every 8 (eight) hours as needed. Patient not taking: Reported on 04/13/2019 06/19/18   Caccavale, Sophia, PA-C  pantoprazole (PROTONIX) 20 MG tablet  Take 1 tablet (20 mg total) by mouth daily. Patient not taking: Reported on 06/19/2018 02/16/18   Donnetta Hutching, MD  predniSONE (DELTASONE) 50 MG tablet Take 1 tablet (50 mg total) by mouth daily with breakfast. 04/13/19   Lawyer, Cristal Deer, PA-C  valACYclovir (VALTREX) 1000 MG tablet Take 1 tablet (1,000 mg total) by mouth 3 (three) times daily. 04/13/19   Lawyer, Cristal Deer, PA-C    Allergies    Aspirin, Caffeine, and Sulfa antibiotics  Review of Systems   Review of Systems  Constitutional:  Negative for chills and fever.  HENT: Negative.    Respiratory: Negative.    Cardiovascular: Negative.   Gastrointestinal:  Positive for abdominal pain and diarrhea. Negative for blood in stool, nausea and vomiting.  Genitourinary:  Positive for frequency. Negative for dysuria and flank pain.  Musculoskeletal:  Positive for back pain.  Skin: Negative.   Neurological: Negative.    Physical Exam Updated Vital Signs BP (!) 145/95 (BP Location: Left Arm)   Pulse 64   Temp 98.2 F (36.8 C) (Oral)   Resp 16   Ht 5\' 4"  (1.626 m)   Wt 68 kg   SpO2 100%   BMI 25.75 kg/m   Physical Exam Vitals and nursing note reviewed.  Constitutional:      General: She is not in acute distress.    Appearance: She is well-developed. She is not ill-appearing.  Cardiovascular:     Rate and Rhythm: Normal rate.  Pulmonary:     Effort: Pulmonary effort is normal.  Abdominal:     General: There is no distension.     Palpations: Abdomen is soft.     Tenderness: There is no abdominal tenderness.  Musculoskeletal:        General: Normal range of motion.     Cervical back: Normal range of motion.  Skin:    General: Skin is warm and dry.  Neurological:     Mental Status: She is alert and oriented to person, place, and time.    ED Results / Procedures / Treatments   Labs (all labs ordered are listed, but only abnormal results are displayed) Labs Reviewed  BASIC METABOLIC PANEL - Abnormal; Notable for the  following components:      Result Value   Glucose, Bld 113 (*)    Anion gap 4 (*)    All other components within normal limits  CBC WITH DIFFERENTIAL/PLATELET - Abnormal; Notable for the following components:   RBC 5.23 (*)  MCH 25.2 (*)    All other components within normal limits  URINALYSIS, ROUTINE W REFLEX MICROSCOPIC - Abnormal; Notable for the following components:   Hgb urine dipstick MODERATE (*)    All other components within normal limits   Results for orders placed or performed during the hospital encounter of 12/05/20  Basic metabolic panel  Result Value Ref Range   Sodium 136 135 - 145 mmol/L   Potassium 4.2 3.5 - 5.1 mmol/L   Chloride 104 98 - 111 mmol/L   CO2 28 22 - 32 mmol/L   Glucose, Bld 113 (H) 70 - 99 mg/dL   BUN 19 6 - 20 mg/dL   Creatinine, Ser 7.85 0.44 - 1.00 mg/dL   Calcium 88.5 8.9 - 02.7 mg/dL   GFR, Estimated >74 >12 mL/min   Anion gap 4 (L) 5 - 15  CBC with Differential  Result Value Ref Range   WBC 7.2 4.0 - 10.5 K/uL   RBC 5.23 (H) 3.87 - 5.11 MIL/uL   Hemoglobin 13.2 12.0 - 15.0 g/dL   HCT 87.8 67.6 - 72.0 %   MCV 82.6 80.0 - 100.0 fL   MCH 25.2 (L) 26.0 - 34.0 pg   MCHC 30.6 30.0 - 36.0 g/dL   RDW 94.7 09.6 - 28.3 %   Platelets 335 150 - 400 K/uL   nRBC 0.0 0.0 - 0.2 %   Neutrophils Relative % 50 %   Neutro Abs 3.6 1.7 - 7.7 K/uL   Lymphocytes Relative 36 %   Lymphs Abs 2.6 0.7 - 4.0 K/uL   Monocytes Relative 9 %   Monocytes Absolute 0.6 0.1 - 1.0 K/uL   Eosinophils Relative 4 %   Eosinophils Absolute 0.3 0.0 - 0.5 K/uL   Basophils Relative 1 %   Basophils Absolute 0.1 0.0 - 0.1 K/uL   Immature Granulocytes 0 %   Abs Immature Granulocytes 0.02 0.00 - 0.07 K/uL  Urinalysis, Routine w reflex microscopic Urine, Clean Catch  Result Value Ref Range   Color, Urine YELLOW YELLOW   APPearance CLEAR CLEAR   Specific Gravity, Urine 1.025 1.005 - 1.030   pH 5.0 5.0 - 8.0   Glucose, UA NEGATIVE NEGATIVE mg/dL   Hgb urine dipstick  MODERATE (A) NEGATIVE   Bilirubin Urine NEGATIVE NEGATIVE   Ketones, ur NEGATIVE NEGATIVE mg/dL   Protein, ur NEGATIVE NEGATIVE mg/dL   Nitrite NEGATIVE NEGATIVE   Leukocytes,Ua NEGATIVE NEGATIVE   RBC / HPF 21-50 0 - 5 RBC/hpf   WBC, UA 0-5 0 - 5 WBC/hpf   Bacteria, UA NONE SEEN NONE SEEN   Squamous Epithelial / LPF 0-5 0 - 5   Mucus PRESENT     EKG None  Radiology No results found.  Procedures Procedures   Medications Ordered in ED Medications - No data to display  ED Course  I have reviewed the triage vital signs and the nursing notes.  Pertinent labs & imaging results that were available during my care of the patient were reviewed by me and considered in my medical decision making (see chart for details).    MDM Rules/Calculators/A&P                           Patient to ED with chronic urinary frequency, no worse tonight, now with hematuria. She also reports chronic diarrhea, nonbloody, with history of IBS. Has not seen PCP or specialist.  Well appearing. Labs unremarkable with exception of finding of hematuria.  Discussed that we would obtain a CT renal study. Discussed likely discharge without diagnosis of cause of urinary frequency or chronic diarrhea but would provide referrals. Patient care signed out to Luther Hearing, [facility], pending review of CT renal.   Final Clinical Impression(s) / ED Diagnoses Final diagnoses:  None   Hematuria Diarrhea  Rx / DC Orders ED Discharge Orders     None        Elpidio Anis, PA-C 12/05/20 [id number]    Gilda Crease, MD 12/05/20 [phone]

## 2021-02-03 ENCOUNTER — Other Ambulatory Visit: Payer: Self-pay

## 2021-02-03 ENCOUNTER — Encounter: Payer: Self-pay | Admitting: Nurse Practitioner

## 2021-02-03 ENCOUNTER — Ambulatory Visit (INDEPENDENT_AMBULATORY_CARE_PROVIDER_SITE_OTHER): Payer: BC Managed Care – PPO | Admitting: Nurse Practitioner

## 2021-02-03 VITALS — BP 157/84 | HR 63 | Ht 65.0 in | Wt 159.7 lb

## 2021-02-03 DIAGNOSIS — Z5321 Procedure and treatment not carried out due to patient leaving prior to being seen by health care provider: Secondary | ICD-10-CM

## 2021-02-03 DIAGNOSIS — Z1231 Encounter for screening mammogram for malignant neoplasm of breast: Secondary | ICD-10-CM

## 2021-02-03 LAB — POCT URINALYSIS DIP (DEVICE)
Bilirubin Urine: NEGATIVE
Glucose, UA: NEGATIVE mg/dL
Ketones, ur: NEGATIVE mg/dL
Leukocytes,Ua: NEGATIVE
Nitrite: NEGATIVE
Protein, ur: NEGATIVE mg/dL
Specific Gravity, Urine: 1.02 (ref 1.005–1.030)
Urobilinogen, UA: 0.2 mg/dL (ref 0.0–1.0)
pH: 7 (ref 5.0–8.0)

## 2021-02-03 NOTE — Progress Notes (Signed)
Patient is here for annual check up. Robin Franco is in need of a pap smear and mammogram.   Today's concerns:  Going through menopause, frequent hot flashes   Monthly pelvic cramps along with lower back pain  Frequent urination

## 2021-02-04 NOTE — Progress Notes (Signed)
Tawnya Crook 55 y.o. Greeted patient in room and reviewed my name and credentials. Patient was very specific that she wanted to be seen only by an MD - she came to the visit to be seen by the MD.  Jamal Maes to arrange MD to see patient today while she was here but the wait was long and the patient left without being seen today.  Nolene Bernheim, RN, MSN, NP-BC Nurse Practitioner, [facility] for [employer], [facility] 02-03-21 4:00pm

## 2021-03-08 DIAGNOSIS — E039 Hypothyroidism, unspecified: Secondary | ICD-10-CM | POA: Insufficient documentation

## 2021-03-08 DIAGNOSIS — Z720 Tobacco use: Secondary | ICD-10-CM | POA: Insufficient documentation

## 2021-03-08 DIAGNOSIS — I10 Essential (primary) hypertension: Secondary | ICD-10-CM | POA: Insufficient documentation

## 2021-03-08 DIAGNOSIS — N951 Menopausal and female climacteric states: Secondary | ICD-10-CM | POA: Insufficient documentation

## 2021-03-08 NOTE — Progress Notes (Deleted)
GYNECOLOGY ANNUAL PREVENTATIVE CARE ENCOUNTER NOTE  History:     Robin Franco is a 56 y.o. G53P2002 female here for a routine annual gynecologic exam.    Current complaints: Vaginal pain.    Reviewed home medications re: estradiol. ***       Gynecologic History No LMP recorded. Patient is perimenopausal. Contraception: {method:5051} Last Pap: None on file Last Mammogram: None on file, including care everywhere Last Colonoscopy: Never had - ***  Obstetric History OB History  Gravida Para Term Preterm AB Living  2 2 2  0 0 2  SAB IAB Ectopic Multiple Live Births  0 0 0 0      # Outcome Date GA Lbr Len/2nd Weight Sex Delivery Anes PTL Lv  2 Term      CS-Unspec     1 Term      CS-Unspec       Past Medical History:  Diagnosis Date   Bell's palsy    G-6-PD deficiency    GERD (gastroesophageal reflux disease)    Hypertension    IBS (irritable bowel syndrome)    Ovarian cyst    Seasonal allergies    Thyroid disease    HYPER    Past Surgical History:  Procedure Laterality Date   APPENDECTOMY     CESAREAN SECTION     CHOLECYSTECTOMY  05/31/2018   FOOT SURGERY     OVARIAN CYST REMOVAL     TONSILLECTOMY     TUBAL LIGATION      Current Outpatient Medications on File Prior to Visit  Medication Sig Dispense Refill   amLODipine (NORVASC) 5 MG tablet Take 1 tablet (5 mg total) by mouth daily. 30 tablet 0   estradiol (ESTRACE) 1 MG tablet Take 1 mg by mouth daily.     gabapentin (NEURONTIN) 300 MG capsule Take 300 mg by mouth 2 (two) times daily.     levothyroxine (SYNTHROID) 75 MCG tablet Take 1 tablet (75 mcg total) by mouth daily before breakfast. 30 tablet 0   No current facility-administered medications on file prior to visit.    Allergies  Allergen Reactions   Aspirin Nausea Only   Caffeine Nausea Only   Sulfa Antibiotics Nausea And Vomiting    Social History:  reports that she has quit smoking. She has never used smokeless tobacco. She reports  current alcohol use. She reports current drug use.  Family History  Problem Relation Age of Onset   Healthy Mother    Healthy Father     The following portions of the patient's history were reviewed and updated as appropriate: allergies, current medications, past family history, past medical history, past social history, past surgical history and problem list.  Review of Systems Pertinent items noted in HPI and remainder of comprehensive ROS otherwise negative.  Physical Exam:  There were no vitals taken for this visit. CONSTITUTIONAL: Well-developed, well-nourished female in no acute distress.  HENT:  Normocephalic, atraumatic, External right and left ear normal.  EYES: Conjunctivae and EOM are normal. Pupils are equal, round, and reactive to light. No scleral icterus.  NECK: Normal range of motion, supple, no masses.  Normal thyroid.  SKIN: Skin is warm and dry. No rash noted. Not diaphoretic. No erythema. No pallor. MUSCULOSKELETAL: Normal range of motion. No tenderness.  No cyanosis, clubbing, or edema. NEUROLOGIC: Alert and oriented to person, place, and time. Normal reflexes, muscle tone coordination.  PSYCHIATRIC: Normal mood and affect. Normal behavior. Normal judgment and thought content.  CARDIOVASCULAR: Normal heart  rate noted, regular rhythm RESPIRATORY: Clear to auscultation bilaterally. Effort and breath sounds normal, no problems with respiration noted.  BREASTS: Symmetric in size. No masses, tenderness, skin changes, nipple drainage, or lymphadenopathy bilaterally. Performed in the presence of a chaperone. ABDOMEN: Soft, no distention noted.  No tenderness, rebound or guarding.  PELVIC: {PELVIC:22980}. Performed in the presence of a chaperone.  Assessment and Plan:    1. Encounter for annual routine gynecological examination *** - Cervical cancer screening: Discussed guidelines. Pap with HPV done *** - Breast Health: Encouraged self breast awareness/SBE. Discussed  limits of clinical breast exam for detecting breast cancer. Discussed importance of annual MXR.  Had staff aid patient in scheduling prior to d/c *** - Climacteric/Sexual health: Reviewed typical and atypical symptoms of menopause/peri-menopause. Discussed PMB and to call if any amount of spotting.  - Bone Health: Calcium via diet and supplementation. Discussed weight bearing exercise. DEXA due *** - Colonoscopy: {Blank single:19197::"Per PCP","up to date","declines"} - F/U 12 months and prn    2. Vaginal pain ***     Routine preventative health maintenance measures emphasized. Please refer to After Visit Summary for other counseling recommendations.   Milas Hock, MD, FACOG Obstetrician & Gynecologist, [facility] for [facility], [facility]

## 2021-03-10 ENCOUNTER — Ambulatory Visit: Payer: BC Managed Care – PPO | Admitting: Obstetrics and Gynecology

## 2021-03-10 DIAGNOSIS — Z01419 Encounter for gynecological examination (general) (routine) without abnormal findings: Secondary | ICD-10-CM

## 2021-03-10 DIAGNOSIS — R102 Pelvic and perineal pain: Secondary | ICD-10-CM

## 2021-06-07 ENCOUNTER — Emergency Department (HOSPITAL_COMMUNITY)
Admission: EM | Admit: 2021-06-07 | Discharge: 2021-06-07 | Disposition: A | Discharge status: Home / Self Care | Payer: BC Managed Care – PPO | Attending: Emergency Medicine | Admitting: Emergency Medicine

## 2021-06-07 DIAGNOSIS — E039 Hypothyroidism, unspecified: Secondary | ICD-10-CM | POA: Insufficient documentation

## 2021-06-07 DIAGNOSIS — Z79899 Other long term (current) drug therapy: Secondary | ICD-10-CM | POA: Insufficient documentation

## 2021-06-07 DIAGNOSIS — H6122 Impacted cerumen, left ear: Secondary | ICD-10-CM | POA: Insufficient documentation

## 2021-06-07 DIAGNOSIS — I10 Essential (primary) hypertension: Secondary | ICD-10-CM | POA: Insufficient documentation

## 2021-06-07 MED ORDER — CARBAMIDE PEROXIDE 6.5 % OT SOLN: 5.0000 [drp] | Freq: Two times a day (BID) | OTIC | 0 refills | Status: AC

## 2021-06-07 MED ORDER — CARBAMIDE PEROXIDE 6.5 % OT SOLN
10.0000 [drp] | Freq: Once | OTIC | Status: AC
  Administered 2021-06-07: 10 [drp] via OTIC
  Filled 2021-06-07: qty 15

## 2021-06-07 NOTE — ED Notes (Signed)
Provided pt with a cup of coffee per request. ?

## 2021-06-07 NOTE — ED Triage Notes (Signed)
Pt came in and cannot hear out of L ear. Pt states that she gets excessive wax build up in both ears. L ear has wax build-up  ?

## 2021-06-07 NOTE — Discharge Instructions (Addendum)
You can take the ear drops home with you that we tried today. Use this for no more than 3 days. You can use 5 drops twice a day. ?Schedule an appointment with [facility]NT for follow up ?

## 2021-06-07 NOTE — ED Provider Notes (Signed)
?[facility]-EMERGENCY DEPT ?Provider Note ? ? ?CSN: [id number] ?Arrival date & time: 06/07/21  8546 ? ?  ? ?History ?PMH: HTN, Hypothyroidism, Bell's Palsy ?Chief Complaint  ?Patient presents with  ? Ear Fullness  ? ? ?Any Mcneice is a 56 y.o. female. ?She presents with left-sided ear fullness.  She states that she has had the symptoms for 2 days now.  They are constant.  She feels like she cannot hear anything out of her ear.  She tried removing wax at home without relief.  She denies any tinnitus.  She says she has some ear pressure.  No fevers or chills.  No ear drainage noted.  No recent URI. ?She states she has frequent earwax impactions and has been followed by the ear clinic previously ? ?Ear Fullness ? ? ?  ? ?Home Medications ?Prior to Admission medications   ?Medication Sig Start Date End Date Taking? Authorizing Provider  ?carbamide peroxide (DEBROX) 6.5 % OTIC solution Place 5 drops into the left ear 2 (two) times daily for 3 days. 06/07/21 06/10/21 Yes Kynisha Memon, Finis Bud, PA-C  ?amLODipine (NORVASC) 5 MG tablet Take 1 tablet (5 mg total) by mouth daily. 11/30/19 12/30/19  Georgetta Haber, NP  ?estradiol (ESTRACE) 1 MG tablet Take 1 mg by mouth daily. 05/01/20   [provider]  ?gabapentin (NEURONTIN) 300 MG capsule Take 300 mg by mouth 2 (two) times daily. 01/15/21   [provider]  ?levothyroxine (SYNTHROID) 75 MCG tablet Take 1 tablet (75 mcg total) by mouth daily before breakfast. 11/30/19   Georgetta Haber, NP  ?   ? ?Allergies    ?Aspirin, Caffeine, and Sulfa antibiotics   ? ?Review of Systems   ?Review of Systems  ?Constitutional:  Negative for chills and fever.  ?HENT:  Positive for hearing loss. Negative for congestion, ear discharge, ear pain, rhinorrhea, sinus pressure, sinus pain, sore throat and tinnitus.   ?All other systems reviewed and are negative. ? ?Physical Exam ?Updated Vital Signs ?BP 140/77   Pulse 65   Temp 97.9 ?F (36.6 ?C) (Oral)   Resp 18    Ht 5\' 5"  (1.651 m)   Wt 71.2 kg   SpO2 100%   BMI 26.13 kg/m?  ?Physical Exam ?Vitals and nursing note reviewed.  ?Constitutional:   ?   General: She is not in acute distress. ?   Appearance: Normal appearance. She is well-developed. She is not ill-appearing, toxic-appearing or diaphoretic.  ?HENT:  ?   Head: Normocephalic and atraumatic.  ?   Left Ear: There is impacted cerumen.  ?   Ears:  ?   Comments: Unable to visualize TM. No drainage noted. External Ear normal. No mastoid tenderness ?   Nose: No nasal deformity.  ?   Mouth/Throat:  ?   Lips: Pink. No lesions.  ?Eyes:  ?   General: Gaze aligned appropriately. No scleral icterus.    ?   Right eye: No discharge.     ?   Left eye: No discharge.  ?   Conjunctiva/sclera: Conjunctivae normal.  ?   Right eye: Right conjunctiva is not injected. No exudate or hemorrhage. ?   Left eye: Left conjunctiva is not injected. No exudate or hemorrhage. ?Pulmonary:  ?   Effort: Pulmonary effort is normal. No respiratory distress.  ?Skin: ?   General: Skin is warm and dry.  ?Neurological:  ?   Mental Status: She is alert and oriented to person, place, and time.  ?Psychiatric:     ?  Mood and Affect: Mood normal.     ?   Speech: Speech normal.     ?   Behavior: Behavior normal. Behavior is cooperative.  ? ? ?ED Results / Procedures / Treatments   ?Labs ?(all labs ordered are listed, but only abnormal results are displayed) ?Labs Reviewed - No data to display ? ?EKG ?None ? ?Radiology ?No results found. ? ?Procedures ?Marland KitchenEar Cerumen Removal ? ?Date/Time: 06/07/2021 9:59 AM ?Performed by: Claudie Leach, PA-C ?Authorized by: Claudie Leach, PA-C  ? ?Consent:  ?  Consent obtained:  Verbal ?  Consent given by:  Patient ?  Risks, benefits, and alternatives were discussed: yes   ?  Risks discussed:  Bleeding, dizziness, incomplete removal, infection, TM perforation and pain ?  Alternatives discussed:  No treatment, alternative treatment and delayed treatment ?Universal  protocol:  ?  Procedure explained and questions answered to patient or proxy's satisfaction: yes   ?  Patient identity confirmed:  Verbally with patient ?Procedure details:  ?  Location:  L ear ?  Procedure type: curette   ?  Procedure type comment:  Curette and irrigation ?  Procedure outcomes: cerumen removed (partially)   ?Post-procedure details:  ?  Inspection:  No bleeding and some cerumen remaining ?  Hearing quality:  Diminished ?  Procedure completion:  Procedure terminated at patient's request  ? ? ?Medications Ordered in ED ?Medications  ?carbamide peroxide (DEBROX) 6.5 % OTIC (EAR) solution 10 drop (10 drops Left EAR Given 06/07/21 0921)  ? ? ?ED Course/ Medical Decision Making/ A&P ?  ?                        ?Medical Decision Making ?Risk ?OTC drugs. ? ? ?Patient presents here with left ear fullness.  She has a history of frequent ear wax impactions.  She was unable to get relief with her home treatments.  We will try to do a earwax irrigation here. ?Unsuccessful attempt after irritation with saline and peroxide. ?Attempted Debrox ?I was able to curette a good portion of wax out following this procedure. We ultimately terminated the procedure due to patient discomfort. Still unable to see TM. No complications to procedure noted. Will send home with Debrox drops. F/u with ENT. ? ?Final Clinical Impression(s) / ED Diagnoses ?Final diagnoses:  ?Impacted cerumen of left ear  ? ? ?Rx / DC Orders ?ED Discharge Orders   ? ?      Ordered  ?  carbamide peroxide (DEBROX) 6.5 % OTIC solution  2 times daily       ? 06/07/21 0958  ? ?  ?  ? ?  ? ? ?  ?Claudie Leach, PA-C ?06/07/21 1002 ? ?  ?Sloan Leiter, DO ?06/07/21 1008 ? ?

## 2021-06-07 NOTE — ED Notes (Signed)
I provided irrigation of L ear with normal saline. Noticeable earwax was removed. Pt currently states no change in hearing following removal. ?

## 2021-06-07 NOTE — ED Notes (Signed)
I provided reinforced discharge education based off of discharge instructions. Pt acknowledged and understood my education. Pt had no further questions/concerns for provider/myself.  °

## 2021-06-10 DIAGNOSIS — H900 Conductive hearing loss, bilateral: Secondary | ICD-10-CM | POA: Insufficient documentation

## 2021-06-10 DIAGNOSIS — H9202 Otalgia, left ear: Secondary | ICD-10-CM | POA: Insufficient documentation

## 2021-10-31 ENCOUNTER — Other Ambulatory Visit: Payer: Self-pay

## 2021-10-31 ENCOUNTER — Encounter (HOSPITAL_COMMUNITY): Payer: Self-pay

## 2021-10-31 ENCOUNTER — Emergency Department (HOSPITAL_COMMUNITY)
Admission: EM | Admit: 2021-10-31 | Discharge: 2021-10-31 | Disposition: A | Discharge status: Home / Self Care | Payer: BC Managed Care – PPO | Attending: Emergency Medicine | Admitting: Emergency Medicine

## 2021-10-31 DIAGNOSIS — Z79899 Other long term (current) drug therapy: Secondary | ICD-10-CM | POA: Insufficient documentation

## 2021-10-31 DIAGNOSIS — R197 Diarrhea, unspecified: Secondary | ICD-10-CM | POA: Diagnosis present

## 2021-10-31 DIAGNOSIS — R110 Nausea: Secondary | ICD-10-CM | POA: Diagnosis not present

## 2021-10-31 DIAGNOSIS — I10 Essential (primary) hypertension: Secondary | ICD-10-CM | POA: Insufficient documentation

## 2021-10-31 LAB — COMPREHENSIVE METABOLIC PANEL
ALT: 15 U/L (ref 0–44)
AST: 19 U/L (ref 15–41)
Albumin: 4.5 g/dL (ref 3.5–5.0)
Alkaline Phosphatase: 88 U/L (ref 38–126)
Anion gap: 4 — ABNORMAL LOW (ref 5–15)
BUN: 11 mg/dL (ref 6–20)
CO2: 26 mmol/L (ref 22–32)
Calcium: 10.2 mg/dL (ref 8.9–10.3)
Chloride: 107 mmol/L (ref 98–111)
Creatinine, Ser: 0.84 mg/dL (ref 0.44–1.00)
GFR, Estimated: >60 mL/min (ref >60)
Glucose, Bld: 89 mg/dL (ref 70–99)
Potassium: 4.2 mmol/L (ref 3.5–5.1)
Sodium: 137 mmol/L (ref 135–145)
Total Bilirubin: 0.7 mg/dL (ref 0.3–1.2)
Total Protein: 8.2 g/dL — ABNORMAL HIGH (ref 6.5–8.1)

## 2021-10-31 LAB — CBC WITH DIFFERENTIAL/PLATELET
Abs Immature Granulocytes: 0.02 10*3/uL (ref 0.00–0.07)
Basophils Absolute: 0.1 10*3/uL (ref 0.0–0.1)
Basophils Relative: 1 %
Eosinophils Absolute: 0.3 10*3/uL (ref 0.0–0.5)
Eosinophils Relative: 4 %
HCT: 41.4 % (ref 36.0–46.0)
Hemoglobin: 12.7 g/dL (ref 12.0–15.0)
Immature Granulocytes: 0 %
Lymphocytes Relative: 36 %
Lymphs Abs: 2.7 10*3/uL (ref 0.7–4.0)
MCH: 25.3 pg — ABNORMAL LOW (ref 26.0–34.0)
MCHC: 30.7 g/dL (ref 30.0–36.0)
MCV: 82.6 fL (ref 80.0–100.0)
Monocytes Absolute: 0.5 10*3/uL (ref 0.1–1.0)
Monocytes Relative: 7 %
Neutro Abs: 3.9 10*3/uL (ref 1.7–7.7)
Neutrophils Relative %: 52 %
Platelets: 344 10*3/uL (ref 150–400)
RBC: 5.01 MIL/uL (ref 3.87–5.11)
RDW: 14.7 % (ref 11.5–15.5)
WBC: 7.6 10*3/uL (ref 4.0–10.5)
nRBC: 0 % (ref 0.0–0.2)

## 2021-10-31 LAB — URINALYSIS, ROUTINE W REFLEX MICROSCOPIC
Bilirubin Urine: NEGATIVE
Glucose, UA: NEGATIVE mg/dL
Hgb urine dipstick: NEGATIVE
Ketones, ur: NEGATIVE mg/dL
Leukocytes,Ua: NEGATIVE
Nitrite: NEGATIVE
Protein, ur: NEGATIVE mg/dL
Specific Gravity, Urine: 1.015 (ref 1.005–1.030)
pH: 8 (ref 5.0–8.0)

## 2021-10-31 LAB — I-STAT BETA HCG BLOOD, ED (MC, WL, AP ONLY): I-stat hCG, quantitative: <5 m[IU]/mL (ref <5)

## 2021-10-31 LAB — LIPASE, BLOOD: Lipase: 34 U/L (ref 11–51)

## 2021-10-31 MED ORDER — ONDANSETRON HCL 4 MG/2ML IJ SOLN
4.0000 mg | Freq: Once | INTRAMUSCULAR | Status: AC
  Administered 2021-10-31: 4 mg via INTRAVENOUS
  Filled 2021-10-31: qty 2

## 2021-10-31 MED ORDER — SODIUM CHLORIDE 0.9 % IV BOLUS
1000.0000 mL | Freq: Once | INTRAVENOUS | Status: AC
  Administered 2021-10-31: 1000 mL via INTRAVENOUS

## 2021-10-31 NOTE — ED Provider Notes (Signed)
[place] DEPT Provider Note   CSN: [id number] Arrival date & time: 10/31/21  0602     History  Chief Complaint  Patient presents with   Diarrhea    Robin Franco is a 56 y.o. female.  Patient presents to hospital with chief complaint of diarrhea.  Patient states that for approximately the past week she has had diarrhea since having exposure to feces at work.  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  She states when workers excavated the feces there was feces up to her ankles across the building and that she spent hours cleaning the mess.  She also endorses nausea with dry heaving for the past few days.  She complains of multiple episodes of diarrhea daily.  She also endorses seeing white mucus with one of her bowel movements and endorses headaches.  No abdominal pain noted at this time.  The patient has a history of GERD, G6PD deficiency, hypertension, thyroid disease, irritable bowel syndrome  HPI     Home Medications Prior to Admission medications   Medication Sig Start Date End Date Taking? Authorizing Provider  amLODipine (NORVASC) 5 MG tablet Take 1 tablet (5 mg total) by mouth daily. 11/30/19 12/30/19  Zigmund Gottron, NP  estradiol (ESTRACE) 1 MG tablet Take 1 mg by mouth daily. 05/01/20   [provider]  gabapentin (NEURONTIN) 300 MG capsule Take 300 mg by mouth 2 (two) times daily. 01/15/21   [provider]  levothyroxine (SYNTHROID) 75 MCG tablet Take 1 tablet (75 mcg total) by mouth daily before breakfast. 11/30/19   Augusto Gamble B, NP      Allergies    Aspirin, Caffeine, and Sulfa antibiotics    Review of Systems   Review of Systems  Respiratory:  Negative for shortness of breath.   Cardiovascular:  Negative for chest pain.  Gastrointestinal:  Positive for diarrhea and nausea. Negative for abdominal pain, blood in stool, constipation and vomiting.  Genitourinary:  Negative for  dysuria.    Physical Exam Updated Vital Signs BP 129/77   Pulse 66   Temp 98.4 F (36.9 C) (Oral)   Resp 16   Ht 5\' 5"  (1.651 m)   Wt 71.2 kg   SpO2 98%   BMI 26.12 kg/m  Physical Exam Vitals and nursing note reviewed.  Constitutional:      General: She is not in acute distress.    Appearance: She is well-developed.  HENT:     Head: Normocephalic and atraumatic.  Eyes:     Conjunctiva/sclera: Conjunctivae normal.  Cardiovascular:     Rate and Rhythm: Normal rate and regular rhythm.     Heart sounds: No murmur heard. Pulmonary:     Effort: Pulmonary effort is normal. No respiratory distress.     Breath sounds: Normal breath sounds.  Abdominal:     Palpations: Abdomen is soft.     Tenderness: There is no abdominal tenderness.  Musculoskeletal:        General: No swelling.     Cervical back: Neck supple.  Skin:    General: Skin is warm and dry.     Capillary Refill: Capillary refill takes less than 2 seconds.  Neurological:     Mental Status: She is alert.  Psychiatric:        Mood and Affect: Mood normal.     ED Results / Procedures / Treatments   Labs (all labs ordered are listed, but only abnormal  results are displayed) Labs Reviewed  CBC WITH DIFFERENTIAL/PLATELET - Abnormal; Notable for the following components:      Result Value   MCH 25.3 (*)    All other components within normal limits  COMPREHENSIVE METABOLIC PANEL - Abnormal; Notable for the following components:   Total Protein 8.2 (*)    Anion gap 4 (*)    All other components within normal limits  URINE CULTURE  GASTROINTESTINAL PANEL BY PCR, STOOL (REPLACES STOOL CULTURE)  URINALYSIS, ROUTINE W REFLEX MICROSCOPIC  LIPASE, BLOOD  I-STAT BETA HCG BLOOD, ED ([facility], [facility], [facility] ONLY)    EKG None  Radiology No results found.  Procedures Procedures    Medications Ordered in ED Medications  sodium chloride 0.9 % bolus 1,000 mL (0 mLs Intravenous Stopped 10/31/21 0912)  ondansetron (ZOFRAN)  injection 4 mg (4 mg Intravenous Given 10/31/21 0750)    ED Course/ Medical Decision Making/ A&P                           Medical Decision Making Risk Prescription drug management.   Patient presents to the hospital complaining of diarrhea and nausea ongoing for days.  Differential includes but is not limited to gastroenteritis, colitis, and others  I reviewed the patient's past medical history.  This includes previous urgent care visits showing colitis in October of the past year.  No other related visits.  Patient does state that she has had a history of a similar condition after previous exposure at work.  I ordered and reviewed labs.  Pertinent results include grossly normal CMP, CBC, negative i-STAT beta pregnancy, lipase 34, grossly normal urinalysis, gastrointestinal stool panel pending  The patient had no acute abdominal pain.  She has previous appendectomy and cholecystectomy.  I saw no indication for imaging at this time  I ordered the patient fluids for rehydration and Zofran for nausea.  The patient was feeling somewhat better after this.  This is likely gastroenteritis, with no blood in the stool and previous surgeries removing the appendix and gallbladder.  Her stool sample today was solid in nature and the C. difficile panel was discontinued.  Patient is mostly concerned about exposure at work.  GI panel pending.  Patient may follow-up with MyChart and her primary care for results of the GI panel.  Patient understands this.  Vitals are normal.  Discharge home with        Final Clinical Impression(s) / ED Diagnoses Final diagnoses:  Diarrhea, unspecified type    Rx / DC Orders ED Discharge Orders     None         Pamala Duffel 10/31/21 1149    Terrilee Files, MD 10/31/21 [phone]

## 2021-10-31 NOTE — ED Triage Notes (Signed)
Ambulatory to ED with c/o nausea, diarrhea, and dry heaving for the last few days. States she was at work, had an exposure with feces and had symptoms afterwards. States she's having "white mucous" in her urine. Also states she's been having bad headaches.

## 2021-10-31 NOTE — Discharge Instructions (Signed)
You were seen today for diarrhea.  The gastroenterological panel is pending at this time.  Please follow-up on MyChart for results.  I recommend follow-up with your primary care to discuss results.

## 2021-11-01 LAB — GASTROINTESTINAL PANEL BY PCR, STOOL (REPLACES STOOL CULTURE)

## 2021-11-01 LAB — URINE CULTURE

## 2022-03-13 IMAGING — CT CT RENAL STONE PROTOCOL
2 of 4 series · 16 of 46 positions shown, 18 images · non-contrast
Comparison: CT abdomen and pelvis 06/19/2018

CLINICAL DATA: Hematuria, unknown cause

EXAM:
CT ABDOMEN AND PELVIS WITHOUT CONTRAST
TECHNIQUE: Multidetector CT imaging of the abdomen and pelvis was performed
following the standard protocol without IV contrast.

[Series 2: axial st · axial · 0.75mm/px · z∈[-487,-92]mm · 13 of 89 slices shown, 15 images]
[im 5/89  soft-tissue]
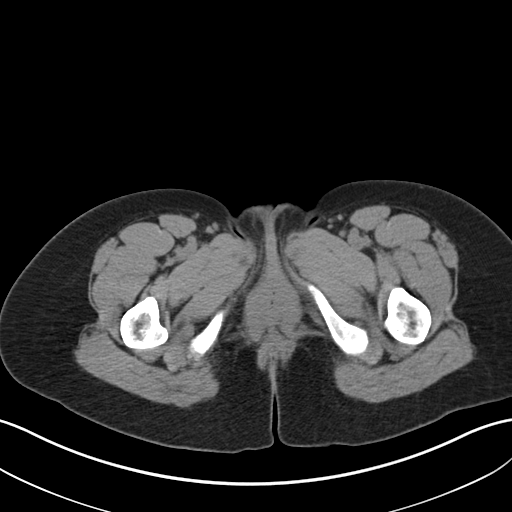
[im 5/89  bone]
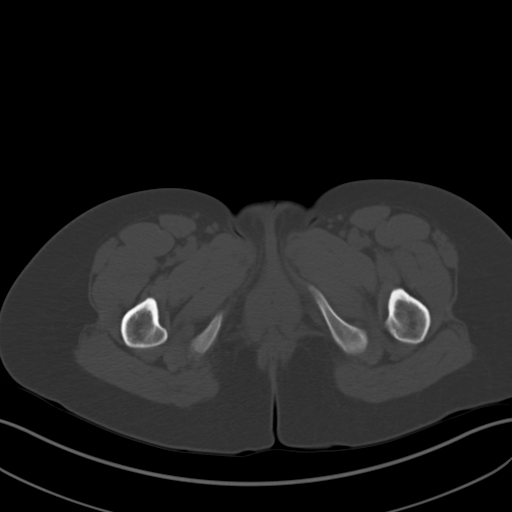
[im 10/89  soft-tissue]
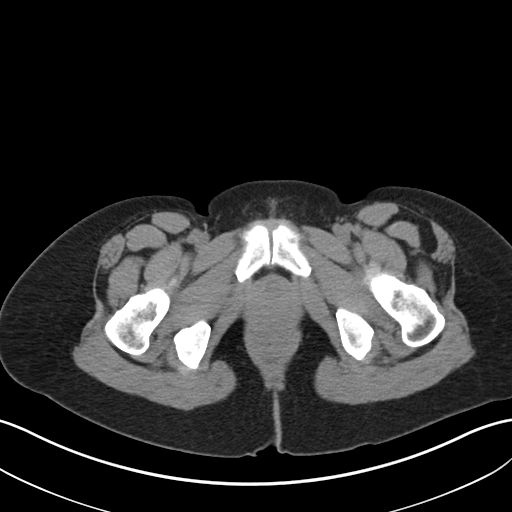
[im 20/89  soft-tissue]
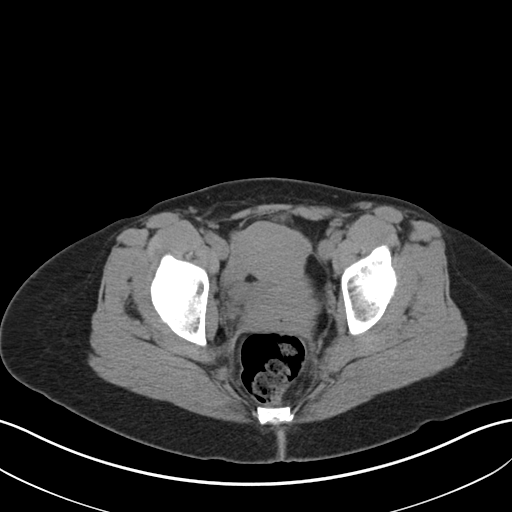
[im 25/89  soft-tissue]
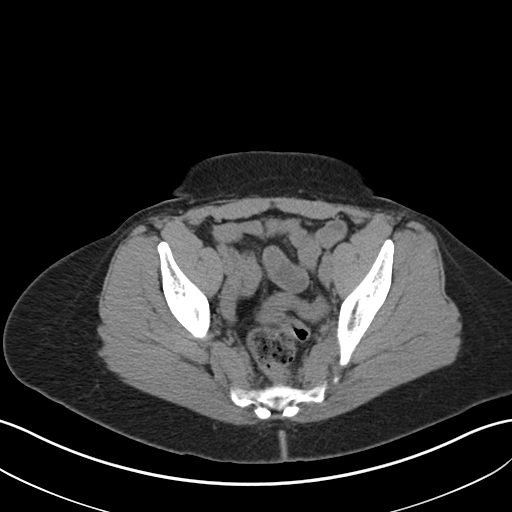
[im 30/89  soft-tissue]
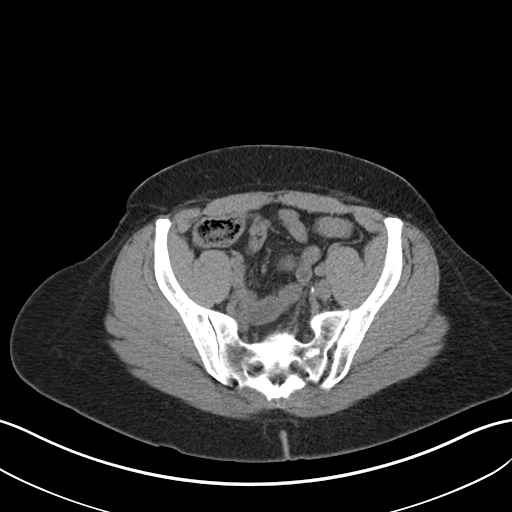
[im 40/89  soft-tissue]
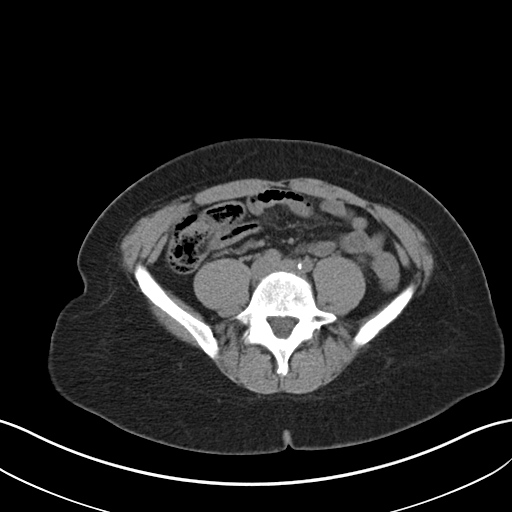
[im 45/89  soft-tissue]
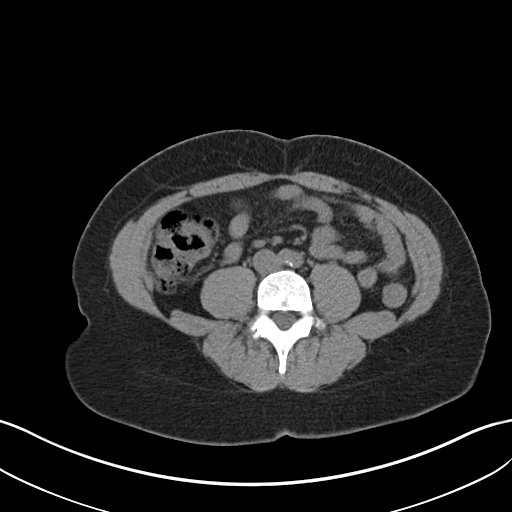
[im 49/89  soft-tissue]
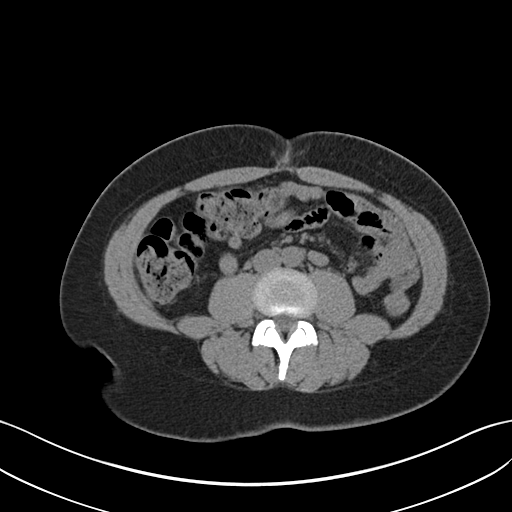
[im 59/89  soft-tissue]
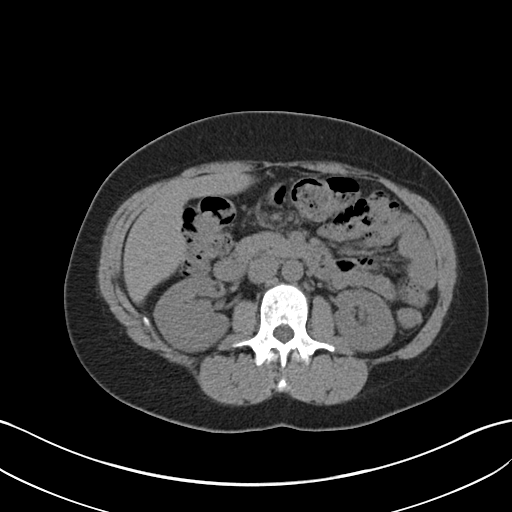
[im 59/89  bone]
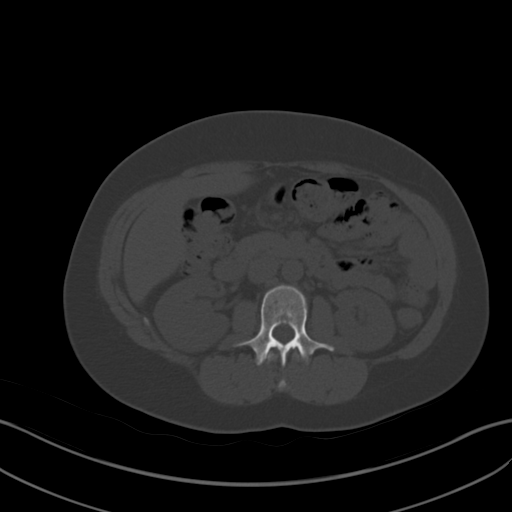
[im 64/89  soft-tissue]
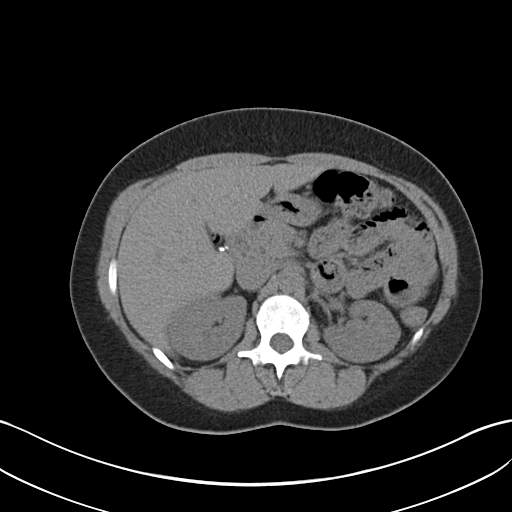
[im 69/89  soft-tissue]
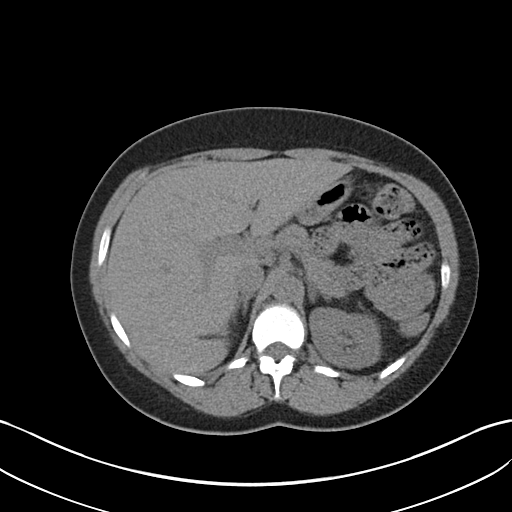
[im 79/89  soft-tissue]
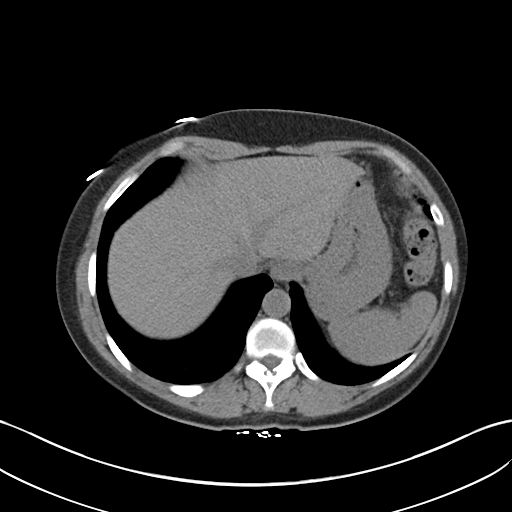
[im 84/89  soft-tissue]
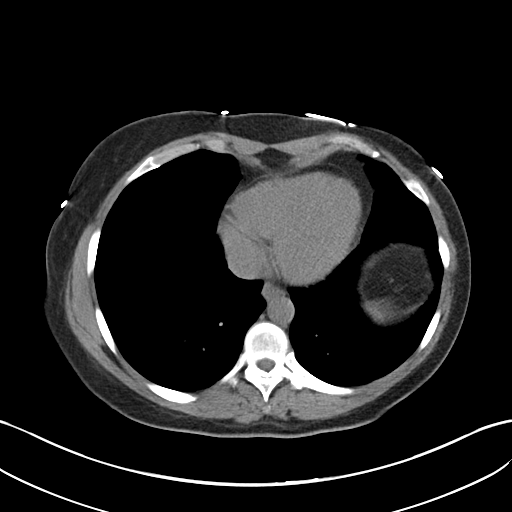

[Series 5: coronal · coronal · 0.70mm/px · 3 of 150 slices shown]
[im 50/150  soft-tissue]
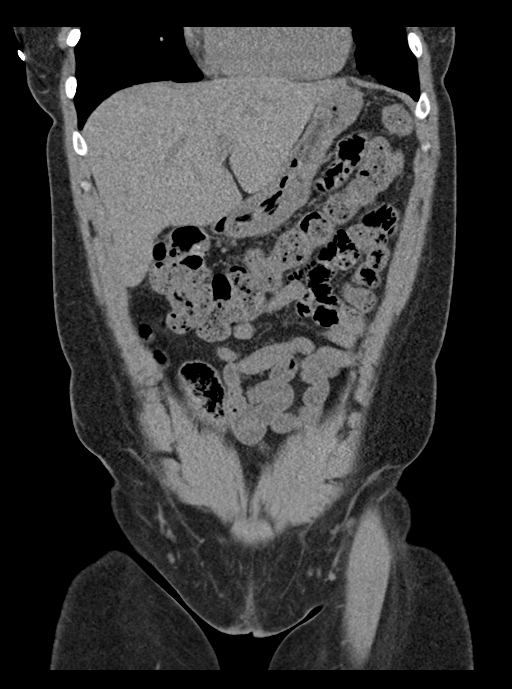
[im 67/150  soft-tissue]
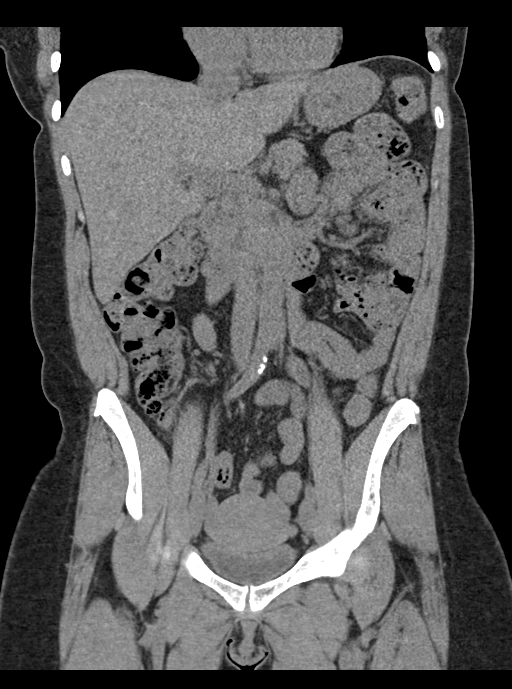
[im 83/150  soft-tissue]
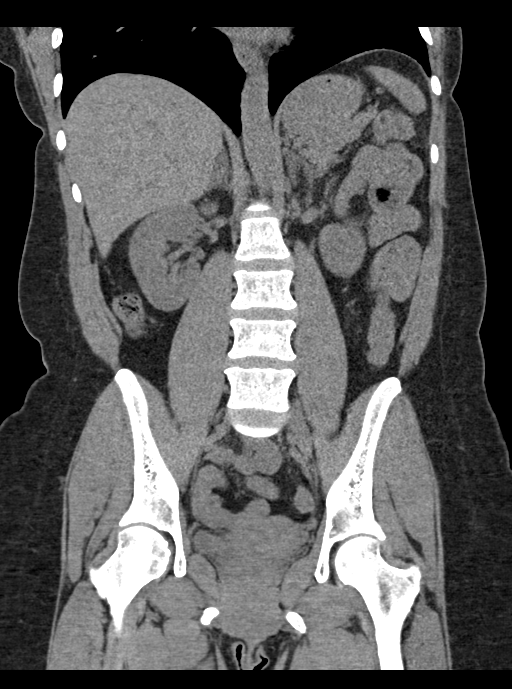

[16 of 46 positions shown; findings below may reference images not displayed]

FINDINGS: Lower chest: No acute abnormality.

Hepatobiliary: No focal liver abnormality is seen. Status post
cholecystectomy. No biliary dilatation.

Pancreas: Unremarkable. No pancreatic ductal dilatation or
surrounding inflammatory changes.

Spleen: Normal in size without focal abnormality.

Adrenals/Urinary Tract: Adrenal glands are unremarkable. Kidneys are
normal, without renal calculi, focal lesion, or hydronephrosis.
Bladder is unremarkable.

Stomach/Bowel: Stomach is within normal limits. Appendix is
surgically absent. No evidence of bowel wall thickening, distention,
or inflammatory changes.

Vascular/Lymphatic: Limited evaluation in the absence of intravenous
contrast. Trace atherosclerotic calcifications in the proximal iliac
arteries bilaterally. No evidence of aneurysm. No suspicious
lymphadenopathy.

Reproductive: Uterus and bilateral adnexa are unremarkable.

Other: No abdominal wall hernia or abnormality. No abdominopelvic
ascites.

Musculoskeletal: No acute fracture or aggressive appearing lytic or
blastic osseous lesion.
IMPRESSION: Unremarkable CT scan of the abdomen and pelvis. Specifically, no
renal or ureteral stones are visualized.

## 2022-05-02 ENCOUNTER — Ambulatory Visit: Payer: BC Managed Care – PPO | Admitting: Neurology

## 2022-05-02 ENCOUNTER — Telehealth: Payer: Self-pay | Admitting: Neurology

## 2022-05-02 NOTE — Telephone Encounter (Signed)
Pt called and stated she has the flu and wants to cancel appointment for today

## 2022-06-27 ENCOUNTER — Encounter: Payer: Self-pay | Admitting: Neurology

## 2022-06-27 ENCOUNTER — Ambulatory Visit: Payer: BC Managed Care – PPO | Admitting: Neurology

## 2022-08-09 ENCOUNTER — Encounter (HOSPITAL_COMMUNITY): Payer: Self-pay | Admitting: *Deleted

## 2022-08-09 ENCOUNTER — Other Ambulatory Visit: Payer: Self-pay

## 2022-08-09 ENCOUNTER — Emergency Department (HOSPITAL_COMMUNITY)
Admission: EM | Admit: 2022-08-09 | Discharge: 2022-08-09 | Disposition: A | Discharge status: Home / Self Care | Payer: BC Managed Care – PPO | Attending: Emergency Medicine | Admitting: Emergency Medicine

## 2022-08-09 DIAGNOSIS — Z8673 Personal history of transient ischemic attack (TIA), and cerebral infarction without residual deficits: Secondary | ICD-10-CM | POA: Insufficient documentation

## 2022-08-09 DIAGNOSIS — E039 Hypothyroidism, unspecified: Secondary | ICD-10-CM | POA: Insufficient documentation

## 2022-08-09 DIAGNOSIS — R202 Paresthesia of skin: Secondary | ICD-10-CM | POA: Insufficient documentation

## 2022-08-09 DIAGNOSIS — I10 Essential (primary) hypertension: Secondary | ICD-10-CM | POA: Diagnosis not present

## 2022-08-09 DIAGNOSIS — Z79899 Other long term (current) drug therapy: Secondary | ICD-10-CM | POA: Insufficient documentation

## 2022-08-09 DIAGNOSIS — G5603 Carpal tunnel syndrome, bilateral upper limbs: Secondary | ICD-10-CM

## 2022-08-09 NOTE — ED Triage Notes (Addendum)
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 Pain in wrist when flexing. Feels like fingers lock up in the mornings, reports a little rt shoulder pain at intervals.

## 2022-08-09 NOTE — ED Provider Notes (Signed)
[place] EMERGENCY DEPARTMENT AT [facility] Provider Note   CSN: [id number] Arrival date & time: 08/09/22  4401     History  Chief Complaint  Patient presents with   hand swelling   tingling in hands    Robin Franco is a 57 y.o. 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  She is right-hand dominant.  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  She awakens at night and has to flick her hand to help the symptoms go away. Denies any chest pain, shortness of breath, visual changes, difficulty with speech or word finding difficulties. She says the symptoms are not consistent with prior strokelike symptoms when she had a CVA in 2009.       Home Medications Prior to Admission medications   Medication Sig Start Date End Date Taking? Authorizing Provider  amLODipine (NORVASC) 5 MG tablet Take 1 tablet (5 mg total) by mouth daily. 11/30/19 12/30/19  Georgetta Haber, NP  estradiol (ESTRACE) 1 MG tablet Take 1 mg by mouth daily. 05/01/20   [provider]  gabapentin (NEURONTIN) 300 MG capsule Take 300 mg by mouth 2 (two) times daily. 01/15/21   [provider]  levothyroxine (SYNTHROID) 75 MCG tablet Take 1 tablet (75 mcg total) by mouth daily before breakfast. 11/30/19   Georgetta Haber, NP      Allergies    Aspirin, Caffeine, and Sulfa antibiotics    Review of Systems   Review of Systems  Constitutional:  Negative for chills and fever.  Respiratory:  Negative for shortness of breath.   Cardiovascular:  Negative for chest pain, palpitations and leg swelling.  Neurological:  Negative for dizziness, facial asymmetry, speech difficulty, weakness and headaches.       Right hand and finger numbness and tingling with swelling    Physical Exam Updated Vital Signs BP (!) 178/90 (BP Location: Right Arm)    Pulse 74   Temp 98.1 F (36.7 C) (Oral)   Resp 18   Ht 5\' 5"  (1.651 m)   Wt 76.2 kg   LMP  (LMP Unknown)   SpO2 100%   BMI 27.96 kg/m  Physical Exam Constitutional:      Appearance: Normal appearance.  Eyes:     Extraocular Movements: Extraocular movements intact.     Conjunctiva/sclera: Conjunctivae normal.     Pupils: Pupils are equal, round, and reactive to light.  Cardiovascular:     Rate and Rhythm: Normal rate and regular rhythm.  Pulmonary:     Effort: Pulmonary effort is normal.     Breath sounds: Normal breath sounds.  Musculoskeletal:     Comments: Swelling of PIP joints and right hand greater than left.  Slightly weakened grip strength on left compared to right.  Strong radial pulses bilaterally.  Warm and well-perfused distal extremity.   Neurological:     General: No focal deficit present.     Mental Status: She is alert and oriented to person, place, and time.     Cranial Nerves: No cranial nerve deficit.     Sensory: No sensory deficit.     Motor: No weakness.  Psychiatric:        Mood and Affect: Mood normal.        Behavior: Behavior normal.     ED Results / Procedures / Treatments   Labs (  all labs ordered are listed, but only abnormal results are displayed) Labs Reviewed - No data to display  EKG None  Radiology No results found.  Procedures Procedures    Medications Ordered in ED Medications - No data to display  ED Course/ Medical Decision Making/ A&P                             Medical Decision Making 57 year old female with chronic paresthesias of right hand is consistent with carpal tunnel syndrome. Pulses are easily palpable, no concern for arterial or vascular occlusion. Also considered CVA especially as patient is high risk with history of CVA and taking unopposed estrogen.  However, her neurological exam is overall reassuring and has no dysarthria, facial asymmetry, symptoms elsewhere. She was certainly has osteoarthritis which  is contributing to the swelling in her joints.  No indication for labs or imaging at this time.  Can consider outpatient nerve conduction study, ultrasound, assessing for vitamin B12 and folate deficiencies.  Discussed nighttime wrist splinting, anti-inflammatories and home exercises. Recommended outpatient follow-up with sports medicine for possible local corticosteroid injection. Discharged home in stable condition with return precautions for          Final Clinical Impression(s) / ED Diagnoses Final diagnoses:  None    Rx / DC Orders ED Discharge Orders     None         Darral Dash, DO 08/09/22 0945    Loetta Rough, MD 08/09/22 1012

## 2022-08-09 NOTE — Progress Notes (Signed)
Orthopedic Tech Progress Note Patient Details:  Robin Franco 1965-08-30 [id number]  Ortho Devices Type of Ortho Device: Velcro wrist splint Ortho Device/Splint Location: bi lat Ortho Device/Splint Interventions: Ordered, Application, Adjustment   Post Interventions Patient Tolerated: Well Instructions Provided: Adjustment of device, Care of device  Kizzie Fantasia 08/09/2022, 10:02 AM

## 2022-08-09 NOTE — Discharge Instructions (Addendum)
It was a pleasure caring for you today in the emergency department.  Your symptoms are most consistent with carpal tunnel syndrome.  You also likely have arthritis in your joints in your hand.    This is initially treated conservatively with wrist splinting at nighttime.The brace keeps your wrist in a neutral position, reducing pressure on the median nerve and alleviating symptoms.   You may also call the sports medicine (see phone number below) to discuss steroid injections to help with your symptoms because they are progressively getting worse.  [facility] [address] [place], [place] [phone]  Please return to the emergency department for any worsening or worrisome symptoms, including asymmetric weakness, worsening numbness/tingling, facial droop, slurred speech, headache, visual changes, neck pain, fevers/chills.

## 2022-08-25 ENCOUNTER — Encounter: Payer: Self-pay | Admitting: Family Medicine

## 2022-08-25 ENCOUNTER — Ambulatory Visit: Payer: BC Managed Care – PPO | Admitting: Family Medicine

## 2022-08-25 VITALS — BP 132/86 | Ht 64.0 in | Wt 168.0 lb

## 2022-08-25 DIAGNOSIS — G5603 Carpal tunnel syndrome, bilateral upper limbs: Secondary | ICD-10-CM

## 2022-08-25 MED ORDER — PREDNISONE 10 MG PO TABS: ORAL_TABLET | ORAL | 0 refills | Status: DC

## 2022-08-25 NOTE — Patient Instructions (Signed)
You have carpal tunnel syndrome. Wear the wrist brace at nighttime and as often as possible during the day Take prednisone dose pack as directed x 6 days. Don't take aleve or ibuprofen while on this but ok to take this starting the day after you finish the prednisone. Corticosteroid injection is a consideration to help with pain and inflammation - call me if not improving in the next 1-2 weeks and this would be the next step. If not improving, will consider nerve conduction studies to assess severity. Follow up with me in 1 month otherwise.
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
## 2022-09-26 ENCOUNTER — Ambulatory Visit: Payer: BC Managed Care – PPO | Admitting: Family Medicine

## 2023-01-02 DIAGNOSIS — N3941 Urge incontinence: Secondary | ICD-10-CM | POA: Insufficient documentation

## 2023-01-18 ENCOUNTER — Encounter: Payer: Self-pay | Admitting: Family

## 2023-01-18 ENCOUNTER — Ambulatory Visit: Payer: BC Managed Care – PPO | Admitting: Family

## 2023-01-18 VITALS — BP 158/93 | HR 72 | Temp 97.7°F | Ht 64.0 in | Wt 174.8 lb

## 2023-01-18 DIAGNOSIS — E039 Hypothyroidism, unspecified: Secondary | ICD-10-CM | POA: Diagnosis not present

## 2023-01-18 DIAGNOSIS — Z0100 Encounter for examination of eyes and vision without abnormal findings: Secondary | ICD-10-CM | POA: Diagnosis not present

## 2023-01-18 DIAGNOSIS — I10 Essential (primary) hypertension: Secondary | ICD-10-CM

## 2023-01-18 DIAGNOSIS — M549 Dorsalgia, unspecified: Secondary | ICD-10-CM

## 2023-01-18 DIAGNOSIS — Z7689 Persons encountering health services in other specified circumstances: Secondary | ICD-10-CM | POA: Diagnosis not present

## 2023-01-18 DIAGNOSIS — M542 Cervicalgia: Secondary | ICD-10-CM

## 2023-01-18 DIAGNOSIS — H0520 Unspecified exophthalmos: Secondary | ICD-10-CM

## 2023-01-18 MED ORDER — TIZANIDINE HCL 2 MG PO TABS: 4.0000 mg | ORAL_TABLET | Freq: Three times a day (TID) | ORAL | 2 refills | Status: DC | PRN

## 2023-01-18 MED ORDER — LEVOTHYROXINE SODIUM 75 MCG PO TABS: 75.0000 ug | ORAL_TABLET | Freq: Every day | ORAL | 0 refills | Status: DC

## 2023-01-18 MED ORDER — AMLODIPINE BESYLATE 10 MG PO TABS: 10.0000 mg | ORAL_TABLET | Freq: Every day | ORAL | 0 refills | Status: DC

## 2023-01-18 NOTE — Progress Notes (Signed)
Subjective:    Robin Franco - 57 y.o. female MRN [id number]  Date of birth: 1965/09/09  HPI  Robin Franco is to establish care.   Current issues and/or concerns: - Doing well on Amlodipine, no issues/concerns. She does not check blood pressure outside of office. She does not complain of red flag symptoms such as but not limited to chest pain, shortness of breath, worst headache of life, nausea/vomiting.  - Doing well on Levothyroxine, no issues/concerns. Reports recent appointment with [facility] for routine evaluation. States she has "bulging eyes" secondary to hypothyroidism. Reports [facility] recommended referral to a thyroid eye disease doctor.  - Needs refills of Tizanidine for neck and lower back pain. Denies recent trauma/injury and red flag symptoms.  - No further issues/concerns for discussion today.    ROS per HPI    Health Maintenance:  Health Maintenance Due  Topic Date Due   HIV Screening  Never done   Hepatitis C Screening  Never done   DTaP/Tdap/Td (1 - Tdap) Never done   Cervical Cancer Screening (HPV/Pap Cotest)  Never done   Colonoscopy  Never done   MAMMOGRAM  Never done   COVID-19 Vaccine (1 - 2023-24 season) Never done     Past Medical History: Patient Active Problem List   Diagnosis Date Noted   Acquired hypothyroidism 03/08/2021   Essential hypertension 03/08/2021   Menopausal flushing 03/08/2021   Tobacco abuse 03/08/2021   Snoring 08/29/2019   Acute on chronic cholecystitis 06/25/2018   G-6-PD deficiency    IBS (irritable bowel syndrome)    Thyroid disease    Bell's palsy    GERD (gastroesophageal reflux disease)       Social History   reports that she has quit smoking. She has never used smokeless tobacco. She reports current alcohol use. She reports current drug use.   Family History  family history includes Healthy in her father and mother.   Medications: reviewed and updated   Objective:   Physical  Exam BP (!) 158/93   Pulse 72   Temp 97.7 F (36.5 C) (Oral)   Ht 5\' 4"  (1.626 m)   Wt 174 lb 12.8 oz (79.3 kg)   LMP  (LMP Unknown)   SpO2 95%   BMI 30.00 kg/m   Physical Exam HENT:     Head: Normocephalic and atraumatic.     Nose: Nose normal.     Mouth/Throat:     Mouth: Mucous membranes are moist.     Pharynx: Oropharynx is clear.  Eyes:     Extraocular Movements: Extraocular movements intact.     Conjunctiva/sclera: Conjunctivae normal.     Pupils: Pupils are equal, round, and reactive to light.  Cardiovascular:     Rate and Rhythm: Normal rate and regular rhythm.     Pulses: Normal pulses.     Heart sounds: Normal heart sounds.  Pulmonary:     Effort: Pulmonary effort is normal.     Breath sounds: Normal breath sounds.  Musculoskeletal:        General: Normal range of motion.     Right shoulder: Normal.     Left shoulder: Normal.     Right upper arm: Normal.     Left upper arm: Normal.     Right elbow: Normal.     Left elbow: Normal.     Right forearm: Normal.     Left forearm: Normal.     Right wrist: Normal.     Left wrist:  Normal.     Right hand: Normal.     Left hand: Normal.     Cervical back: Normal, normal range of motion and neck supple.     Thoracic back: Normal.     Lumbar back: Normal.     Right hip: Normal.     Left hip: Normal.     Right upper leg: Normal.     Left upper leg: Normal.     Right knee: Normal.     Left knee: Normal.     Right lower leg: Normal.     Left lower leg: Normal.     Right ankle: Normal.     Left ankle: Normal.     Right foot: Normal.     Left foot: Normal.  Neurological:     General: No focal deficit present.     Mental Status: She is alert and oriented to person, place, and time.  Psychiatric:        Mood and Affect: Mood normal.        Behavior: Behavior normal.        Assessment & Plan:  1. Encounter to establish care - Patient presents today to establish care. During the interim follow-up with  primary provider as scheduled.  - Return for annual physical examination, labs, and health maintenance. Arrive fasting meaning having no food for at least 8 hours prior to appointment. You may have only water or black coffee. Please take scheduled medications as normal.  2. Primary hypertension - Blood pressure not at goal during today's visit. Patient asymptomatic without chest pressure, chest pain, palpitations, shortness of breath, worst headache of life, and any additional red flag symptoms. - Increase Amlodipine from 5 mg to 10 mg.  - Routine screening.  - Counseled on blood pressure goal of less than 130/80, low-sodium, DASH diet, medication compliance, and 150 minutes of moderate intensity exercise per week as tolerated. Counseled on medication adherence and adverse effects. - Follow-up with primary provider in 2 weeks or sooner if needed for blood pressure check. - amLODipine (NORVASC) 10 MG tablet; Take 1 tablet (10 mg total) by mouth daily.  Dispense: 90 tablet; Refill: 0 - Basic Metabolic Panel  3. Hypothyroidism, unspecified type - Continue Levothyroxine as prescribed. Counseled on medication adherence/adverse effects.  - Routine screening.  - Follow-up with primary provider as scheduled.  - levothyroxine (SYNTHROID) 75 MCG tablet; Take 1 tablet (75 mcg total) by mouth daily before breakfast.  Dispense: 90 tablet; Refill: 0 - TSH  4. Routine eye exam 5. Bulging eyes - Referral to Ophthalmology for evaluation/management. Requesting appointment with provider who can manage possible thyroid eye disease.  - Ambulatory referral to Ophthalmology  6. Neck pain 7. Back pain, unspecified back location, unspecified back pain laterality, unspecified chronicity - Continue Tizanidine as prescribed. Counseled on medication adherence/adverse effects.  - Follow-up with primary provider as scheduled.  - tiZANidine (ZANAFLEX) 2 MG tablet; Take 2 tablets (4 mg total) by mouth every 8 (eight)  hours as needed for muscle spasms.  Dispense: 30 tablet; Refill: 2   Patient was given clear instructions to go to Emergency Department or return to medical center if symptoms don't improve, worsen, or new problems develop.The patient verbalized understanding.  I discussed the assessment and treatment plan with the patient. The patient was provided an opportunity to ask questions and all were answered. The patient agreed with the plan and demonstrated an understanding of the instructions.   The patient was advised to call back  or seek an in-person evaluation if the symptoms worsen or if the condition fails to improve as anticipated.    Ricky Stabs, NP 01/18/2023, 3:34 PM Primary Care at [facility]

## 2023-01-18 NOTE — Progress Notes (Signed)
Patient states she needs to be seen at an thyroid eye Dr. Andrey Cota thyroid is messing with her vision.  Patient asking for muscle relaxer, states pain in neck and lower back.  Needs refills on her medications.

## 2023-01-19 ENCOUNTER — Telehealth: Payer: Self-pay

## 2023-01-19 ENCOUNTER — Other Ambulatory Visit: Payer: Self-pay | Admitting: Family

## 2023-01-19 DIAGNOSIS — Z13228 Encounter for screening for other metabolic disorders: Secondary | ICD-10-CM

## 2023-01-19 DIAGNOSIS — E039 Hypothyroidism, unspecified: Secondary | ICD-10-CM

## 2023-01-19 LAB — BASIC METABOLIC PANEL
BUN/Creatinine Ratio: 16 (ref 9–23)
BUN: 18 mg/dL (ref 6–24)
CO2: 22 mmol/L (ref 20–29)
Calcium: 9.7 mg/dL (ref 8.7–10.2)
Chloride: 105 mmol/L (ref 96–106)
Creatinine, Ser: 1.14 mg/dL — ABNORMAL HIGH (ref 0.57–1.00)
Glucose: 100 mg/dL — ABNORMAL HIGH (ref 70–99)
Potassium: 4.7 mmol/L (ref 3.5–5.2)
Sodium: 139 mmol/L (ref 134–144)
eGFR: 57 mL/min/{1.73_m2} — ABNORMAL LOW (ref >59)

## 2023-01-19 LAB — TSH: TSH: 12 u[IU]/mL — ABNORMAL HIGH (ref 0.450–4.500)

## 2023-01-19 MED ORDER — LEVOTHYROXINE SODIUM 88 MCG PO TABS: 88.0000 ug | ORAL_TABLET | Freq: Every day | ORAL | 1 refills | Status: DC

## 2023-01-19 NOTE — Telephone Encounter (Signed)
Pt was called and vm was left, Information has been sent to nurse pool.

## 2023-01-19 NOTE — Telephone Encounter (Signed)
-----   Message from Nurse Eustace Pen B sent at 01/19/2023 10:15 AM EST -----  ----- Message ----- From: Rema Fendt, NP Sent: 01/19/2023   8:06 AM EST To: Claudie Leach, CMA  - Kidney function mildly lower than normal. Please call our office to schedule a lab only appointment to recheck in 4 weeks.  - Thyroid function above goal. Increase Levothyroxine from 75 mcg to 88 mcg. Also, referral to Endocrinology for evaluation/management. Expect call soon with appointment details.

## 2023-02-21 ENCOUNTER — Ambulatory Visit: Payer: BC Managed Care – PPO | Admitting: Family

## 2023-02-28 ENCOUNTER — Other Ambulatory Visit: Payer: Self-pay | Admitting: Family

## 2023-02-28 DIAGNOSIS — M549 Dorsalgia, unspecified: Secondary | ICD-10-CM

## 2023-02-28 DIAGNOSIS — M542 Cervicalgia: Secondary | ICD-10-CM

## 2023-02-28 NOTE — Telephone Encounter (Signed)
 Medication Refill -  Most Recent Primary Care Visit:  Provider: LORREN NO J  Department: [facility]  Visit Type: NEW PATIENT  Date: 01/18/2023  Medication: tiZANidine  (ZANAFLEX ) 2 MG tablet   Has the patient contacted their pharmacy? No   Is this the correct pharmacy for this prescription? Yes  This is the patient's preferred pharmacy:   [facility] [address] [place], [place] [address] Phone [phone] fax [phone]  Has the prescription been filled recently? Yes  Is the patient out of the medication? Yes  Has the patient been seen for an appointment in the last year OR does the patient have an upcoming appointment? Yes  Can we respond through MyChart? Yes  Agent: Please be advised that Rx refills may take up to 3 business days. We ask that you follow-up with your pharmacy.

## 2023-03-01 NOTE — Telephone Encounter (Signed)
 Requested medication (s) are due for refill today: Yes  Requested medication (s) are on the active medication list: Yes  Last refill:  01/18/23, #30, 2RF  Future visit scheduled: No  Notes to clinic:  Unable to refill per protocol, cannot delegate.      Requested Prescriptions  Pending Prescriptions Disp Refills   tiZANidine  (ZANAFLEX ) 2 MG tablet 30 tablet 2    Sig: Take 2 tablets (4 mg total) by mouth every 8 (eight) hours as needed for muscle spasms.     Not Delegated - Cardiovascular:  Alpha-2 Agonists - tizanidine  Failed - 03/01/2023 12:27 PM      Failed - This refill cannot be delegated      Passed - Valid encounter within last 6 months    Recent Outpatient Visits           1 month ago Encounter to establish care   [facility], Annalee Barren, NP

## 2023-03-02 ENCOUNTER — Telehealth: Payer: Self-pay | Admitting: Family

## 2023-03-02 NOTE — Telephone Encounter (Signed)
Pt wants to know if provider can send muscle relaxer to the pharmacy. Pt states she has severe back pains. Pt has an appt scheduled for 01/28

## 2023-03-02 NOTE — Telephone Encounter (Signed)
Pt asked for muscle relaxer during her visit on 12/04

## 2023-03-03 ENCOUNTER — Other Ambulatory Visit: Payer: Self-pay | Admitting: Family

## 2023-03-03 DIAGNOSIS — M542 Cervicalgia: Secondary | ICD-10-CM

## 2023-03-03 DIAGNOSIS — M549 Dorsalgia, unspecified: Secondary | ICD-10-CM

## 2023-03-03 MED ORDER — TIZANIDINE HCL 2 MG PO TABS: 4.0000 mg | ORAL_TABLET | Freq: Three times a day (TID) | ORAL | 2 refills | Status: DC | PRN

## 2023-03-03 NOTE — Telephone Encounter (Signed)
Complete

## 2023-03-14 ENCOUNTER — Ambulatory Visit (INDEPENDENT_AMBULATORY_CARE_PROVIDER_SITE_OTHER): Payer: BC Managed Care – PPO | Admitting: Family

## 2023-03-14 VITALS — BP 153/94 | HR 75 | Temp 99.3°F | Ht 65.0 in | Wt 171.6 lb

## 2023-03-14 DIAGNOSIS — E039 Hypothyroidism, unspecified: Secondary | ICD-10-CM

## 2023-03-14 DIAGNOSIS — M542 Cervicalgia: Secondary | ICD-10-CM | POA: Diagnosis not present

## 2023-03-14 DIAGNOSIS — M549 Dorsalgia, unspecified: Secondary | ICD-10-CM | POA: Diagnosis not present

## 2023-03-14 DIAGNOSIS — H9202 Otalgia, left ear: Secondary | ICD-10-CM

## 2023-03-14 DIAGNOSIS — I10 Essential (primary) hypertension: Secondary | ICD-10-CM

## 2023-03-14 MED ORDER — AMOXICILLIN-POT CLAVULANATE 875-125 MG PO TABS: 1.0000 | ORAL_TABLET | Freq: Two times a day (BID) | ORAL | 0 refills | Status: DC

## 2023-03-14 MED ORDER — AMLODIPINE BESYLATE 10 MG PO TABS: 10.0000 mg | ORAL_TABLET | Freq: Every day | ORAL | 0 refills | Status: DC

## 2023-03-14 MED ORDER — TIZANIDINE HCL 2 MG PO TABS: 4.0000 mg | ORAL_TABLET | Freq: Three times a day (TID) | ORAL | 2 refills | Status: AC | PRN

## 2023-03-14 MED ORDER — VALSARTAN 40 MG PO TABS: 40.0000 mg | ORAL_TABLET | Freq: Every day | ORAL | 1 refills | Status: DC

## 2023-03-14 NOTE — Progress Notes (Signed)
Patient ID: Robin Franco, female    DOB: 11-Feb-1966  MRN: [id number]  CC: Chronic Conditions Follow-Up  Subjective: Robin Franco is a 58 y.o. female who presents for chronic conditions follow-up.  Her concerns today include:  - Doing well on Amlodipine, no issues/concerns. Home blood pressures above goal. She does not complain of red flag symptoms such as but not limited to chest pain, shortness of breath, worst headache of life, nausea/vomiting.  - Doing well on Levothyroxine, no issues/concerns. States needs referral to Endocrinology within her health insurance network. - Doing well on Tizanidine, no issues/concerns.  - Left ear irritation. Denies red flag symptoms. Appointment with ENT later next month.   Patient Active Problem List   Diagnosis Date Noted   Acquired hypothyroidism 03/08/2021   Essential hypertension 03/08/2021   Menopausal flushing 03/08/2021   Tobacco abuse 03/08/2021   Snoring 08/29/2019   Acute on chronic cholecystitis 06/25/2018   G-6-PD deficiency    IBS (irritable bowel syndrome)    Thyroid disease    Bell's palsy    GERD (gastroesophageal reflux disease)      Current Outpatient Medications on File Prior to Visit  Medication Sig Dispense Refill   estradiol (ESTRACE) 1 MG tablet Take 1 mg by mouth daily.     gabapentin (NEURONTIN) 300 MG capsule Take 300 mg by mouth 2 (two) times daily.     levothyroxine (SYNTHROID) 88 MCG tablet Take 1 tablet (88 mcg total) by mouth daily before breakfast. 30 tablet 1   progesterone (PROMETRIUM) 100 MG capsule Take 100 mg by mouth daily.     No current facility-administered medications on file prior to visit.    Allergies  Allergen Reactions   Aspirin Nausea Only   Caffeine Nausea Only   Sulfa Antibiotics Nausea And Vomiting    Social History   Socioeconomic History   Marital status: Single    Spouse name: Not on file   Number of children: Not on file   Years of education: Not on file   Highest  education level: Not on file  Occupational History   Not on file  Tobacco Use   Smoking status: Former   Smokeless tobacco: Never  Vaping Use   Vaping status: Never Used  Substance and Sexual Activity   Alcohol use: Yes    Comment: occ   Drug use: Yes   Sexual activity: Yes    Birth control/protection: Surgical    Comment: one month ago  Other Topics Concern   Not on file  Social History Narrative   Not on file   Social Drivers of Health   Financial Resource Strain: Low Risk  (01/18/2023)   Overall Financial Resource Strain (CARDIA)    Difficulty of Paying Living Expenses: Not hard at all  Food Insecurity: Not on file  Transportation Needs: Not on file  Physical Activity: Insufficiently Active (01/18/2023)   Exercise Vital Sign    Days of Exercise per Week: 2 days    Minutes of Exercise per Session: 60 min  Stress: No Stress Concern Present (01/18/2023)   [name] of Occupational Health - Occupational Stress Questionnaire    Feeling of Stress : Only a little  Social Connections: Moderately Isolated (01/18/2023)   Social Connection and Isolation Panel [NHANES]    Frequency of Communication with Friends and Family: More than three times a week    Frequency of Social Gatherings with Friends and Family: Once a week    Attends Religious Services: Never  Active Member of Clubs or Organizations: No    Attends Banker Meetings: Never    Marital Status: Living with partner  Intimate Partner Violence: Not on file    Family History  Problem Relation Age of Onset   Healthy Mother    Healthy Father     Past Surgical History:  Procedure Laterality Date   APPENDECTOMY     CESAREAN SECTION     CHOLECYSTECTOMY  05/31/2018   FOOT SURGERY     OVARIAN CYST REMOVAL     TONSILLECTOMY     TUBAL LIGATION      ROS: Review of Systems Negative except as stated above  PHYSICAL EXAM: BP (!) 153/94   Pulse 75   Temp 99.3 F (37.4 C) (Oral)   Ht 5\' 5"   (1.651 m)   Wt 171 lb 9.6 oz (77.8 kg)   LMP  (LMP Unknown)   SpO2 97%   BMI 28.56 kg/m   Physical Exam HENT:     Head: Normocephalic and atraumatic.     Right Ear: Tympanic membrane, ear canal and external ear normal.     Left Ear: Tympanic membrane, ear canal and external ear normal.     Nose: Nose normal.     Mouth/Throat:     Mouth: Mucous membranes are moist.     Pharynx: Oropharynx is clear.  Eyes:     Extraocular Movements: Extraocular movements intact.     Conjunctiva/sclera: Conjunctivae normal.     Pupils: Pupils are equal, round, and reactive to light.  Cardiovascular:     Rate and Rhythm: Normal rate and regular rhythm.     Pulses: Normal pulses.     Heart sounds: Normal heart sounds.  Pulmonary:     Effort: Pulmonary effort is normal.     Breath sounds: Normal breath sounds.  Musculoskeletal:        General: Normal range of motion.     Cervical back: Normal range of motion and neck supple.  Neurological:     General: No focal deficit present.     Mental Status: She is alert and oriented to person, place, and time.  Psychiatric:        Mood and Affect: Mood normal.        Behavior: Behavior normal.      ASSESSMENT AND PLAN: 1. Primary hypertension (Primary) - Blood pressure not at goal during today's visit. Patient asymptomatic without chest pressure, chest pain, palpitations, shortness of breath, worst headache of life, and any additional red flag symptoms. - Continue Amlodipine as prescribed.  - Begin Valsartan as prescribed.  - Routine screening.  - Counseled on blood pressure goal of less than 130/80, low-sodium, DASH diet, medication compliance, and 150 minutes of moderate intensity exercise per week as tolerated. Counseled on medication adherence and adverse effects. - Follow-up with primary provider in 4 weeks or sooner if needed.  - amLODipine (NORVASC) 10 MG tablet; Take 1 tablet (10 mg total) by mouth daily.  Dispense: 90 tablet; Refill: 0 -  valsartan (DIOVAN) 40 MG tablet; Take 1 tablet (40 mg total) by mouth daily.  Dispense: 30 tablet; Refill: 1 - Basic Metabolic Panel  2. Hypothyroidism, unspecified type - Continue Levothyroxine as prescribed. Counseled on medication adherence/adverse effects. No refills needed as of present. - Routine screening.  - Referral to Endocrinology for evaluation/management.  - Follow-up with primary provider as scheduled.  - TSH - Ambulatory referral to Endocrinology  3. Neck pain 4. Back pain, unspecified back  location, unspecified back pain laterality, unspecified chronicity - Continue Tizanidine as prescribed. Counseled on medication adherence/adverse effects.  - Follow-up with primary provider as scheduled.  - tiZANidine (ZANAFLEX) 2 MG tablet; Take 2 tablets (4 mg total) by mouth every 8 (eight) hours as needed for muscle spasms.  Dispense: 30 tablet; Refill: 2  5. Left ear pain - Amoxicillin-Clavulanate as prescribed. Counseled on medication adherence/adverse effects. - Keep all scheduled appointments with ENT.  - Follow-up with primary provider as scheduled  - amoxicillin-clavulanate (AUGMENTIN) 875-125 MG tablet; Take 1 tablet by mouth 2 (two) times daily.  Dispense: 20 tablet; Refill: 0   Patient was given the opportunity to ask questions.  Patient verbalized understanding of the plan and was able to repeat key elements of the plan. Patient was given clear instructions to go to Emergency Department or return to medical center if symptoms don't improve, worsen, or new problems develop.The patient verbalized understanding.   Orders Placed This Encounter  Procedures   TSH   Basic Metabolic Panel   Ambulatory referral to Endocrinology     Requested Prescriptions   Signed Prescriptions Disp Refills   amLODipine (NORVASC) 10 MG tablet 90 tablet 0    Sig: Take 1 tablet (10 mg total) by mouth daily.   valsartan (DIOVAN) 40 MG tablet 30 tablet 1    Sig: Take 1 tablet (40 mg total)  by mouth daily.   amoxicillin-clavulanate (AUGMENTIN) 875-125 MG tablet 20 tablet 0    Sig: Take 1 tablet by mouth 2 (two) times daily.   tiZANidine (ZANAFLEX) 2 MG tablet 30 tablet 2    Sig: Take 2 tablets (4 mg total) by mouth every 8 (eight) hours as needed for muscle spasms.    Return in about 4 weeks (around 04/11/2023) for Follow-Up or next available chronic conditions.  Rema Fendt, NP

## 2023-03-14 NOTE — Progress Notes (Signed)
Patient states slight irritation in both ears. States drainage in left ear.   Asking for thyroid referral that accepts [employer].   Needs refill on tizanidine states [facility] claiming they dont have it.

## 2023-03-15 ENCOUNTER — Encounter: Payer: Self-pay | Admitting: Family

## 2023-03-15 ENCOUNTER — Other Ambulatory Visit: Payer: Self-pay | Admitting: Family

## 2023-03-15 DIAGNOSIS — E039 Hypothyroidism, unspecified: Secondary | ICD-10-CM

## 2023-03-15 LAB — BASIC METABOLIC PANEL
BUN/Creatinine Ratio: 22 (ref 9–23)
BUN: 16 mg/dL (ref 6–24)
CO2: 23 mmol/L (ref 20–29)
Calcium: 10.2 mg/dL (ref 8.7–10.2)
Chloride: 104 mmol/L (ref 96–106)
Creatinine, Ser: 0.72 mg/dL (ref 0.57–1.00)
Glucose: 85 mg/dL (ref 70–99)
Potassium: 4.5 mmol/L (ref 3.5–5.2)
Sodium: 140 mmol/L (ref 134–144)
eGFR: 98 mL/min/{1.73_m2} (ref >59)

## 2023-03-15 LAB — TSH: TSH: 4.69 u[IU]/mL — ABNORMAL HIGH (ref 0.450–4.500)

## 2023-03-15 MED ORDER — LEVOTHYROXINE SODIUM 100 MCG PO TABS: 100.0000 ug | ORAL_TABLET | Freq: Every day | ORAL | 2 refills | Status: DC

## 2023-03-20 ENCOUNTER — Ambulatory Visit: Payer: BC Managed Care – PPO | Admitting: Family

## 2023-03-24 ENCOUNTER — Other Ambulatory Visit: Payer: Self-pay | Admitting: Family

## 2023-03-28 ENCOUNTER — Other Ambulatory Visit: Payer: Self-pay | Admitting: Family

## 2023-03-28 DIAGNOSIS — Z5321 Procedure and treatment not carried out due to patient leaving prior to being seen by health care provider: Secondary | ICD-10-CM

## 2023-03-28 NOTE — Telephone Encounter (Signed)
Gabapentin appears as historical medication. Schedule appointment.

## 2023-03-28 NOTE — Telephone Encounter (Signed)
Copied from CRM [id number]. Topic: Clinical - Medication Refill >> Mar 28, 2023  1:20 PM Makayla J wrote: Most Recent Primary Care Visit:  Provider: Ricky Stabs J  Department: [facility]  Visit Type: OFFICE VISIT  Date: 03/14/2023  Medication: gabapentin (NEURONTIN) 300 MG   Has the patient contacted their pharmacy? Yes (Agent: If no, request that the patient contact the pharmacy for the refill. If patient does not wish to contact the pharmacy document the reason why and proceed with request.) (Agent: If yes, when and what did the pharmacy advise?) Contact PCP  Is this the correct pharmacy for this prescription? Yes If no, delete pharmacy and type the correct one.  This is the patient's preferred pharmacy:  [facility] [address] [place] - [address] [address] Phone: [phone] Fax: [phone]   Has the prescription been filled recently? No  Is the patient out of the medication? Yes  Has the patient been seen for an appointment in the last year OR does the patient have an upcoming appointment? Yes  Can we respond through MyChart? Yes  Agent: Please be advised that Rx refills may take up to 3 business days. We ask that you follow-up with your pharmacy.

## 2023-03-28 NOTE — Telephone Encounter (Signed)
Last Fill: 01/15/21  Last OV: 03/14/23 Next OV: None Scheduled  Routing to provider for review/authorization.

## 2023-03-29 ENCOUNTER — Ambulatory Visit (INDEPENDENT_AMBULATORY_CARE_PROVIDER_SITE_OTHER): Payer: BC Managed Care – PPO | Admitting: Family

## 2023-03-29 ENCOUNTER — Encounter: Payer: Self-pay | Admitting: Family

## 2023-03-29 VITALS — BP 123/76 | HR 67 | Temp 98.1°F | Ht 65.0 in | Wt 167.8 lb

## 2023-03-29 DIAGNOSIS — R52 Pain, unspecified: Secondary | ICD-10-CM

## 2023-03-29 DIAGNOSIS — K219 Gastro-esophageal reflux disease without esophagitis: Secondary | ICD-10-CM

## 2023-03-29 DIAGNOSIS — I10 Essential (primary) hypertension: Secondary | ICD-10-CM

## 2023-03-29 DIAGNOSIS — R232 Flushing: Secondary | ICD-10-CM | POA: Diagnosis not present

## 2023-03-29 DIAGNOSIS — G510 Bell's palsy: Secondary | ICD-10-CM

## 2023-03-29 DIAGNOSIS — E039 Hypothyroidism, unspecified: Secondary | ICD-10-CM

## 2023-03-29 LAB — POCT RAPID STREP A (OFFICE): Rapid Strep A Screen: NEGATIVE

## 2023-03-29 MED ORDER — AMLODIPINE BESYLATE 10 MG PO TABS: 10.0000 mg | ORAL_TABLET | Freq: Every day | ORAL | 0 refills | Status: DC

## 2023-03-29 MED ORDER — GABAPENTIN 300 MG PO CAPS: 300.0000 mg | ORAL_CAPSULE | Freq: Two times a day (BID) | ORAL | 0 refills | Status: DC

## 2023-03-29 MED ORDER — OMEPRAZOLE 20 MG PO CPDR: 20.0000 mg | DELAYED_RELEASE_CAPSULE | Freq: Every day | ORAL | 0 refills | Status: DC

## 2023-03-29 MED ORDER — VALSARTAN 40 MG PO TABS: 40.0000 mg | ORAL_TABLET | Freq: Every day | ORAL | 0 refills | Status: DC

## 2023-03-29 NOTE — Progress Notes (Signed)
Patient states ever since thyroid medication increase she hasn't been able to work.  States her chest feels fluttery.   Patient asking for medications that aren't under your name to be taken over by you.

## 2023-03-29 NOTE — Progress Notes (Signed)
Patient ID: Robin Franco, female    DOB: 17-Feb-1965  MRN: [id number]  CC: Chronic Conditions Follow-Up  Subjective: Robin Franco is a 58 y.o. female who presents for chronic conditions follow-up.  Her concerns today include:  - Doing well on Amlodipine and Valsartan, no issues/concerns. She does not complain of red flag symptoms such as but not limited to chest pain, shortness of breath, worst headache of life, nausea/vomiting.  - States increased dose of Levothyroxine causing tolerable upset stomach. States she needs new referral to Endocrinology due to previous referral was out of health insurance network. - Reports Gabapentin helps with Bell's palsy and hot flashes. No issues/concerns.  - Acid reflux. Denies red flag symptoms. Taking over-the-counter medication with minimal relief.  - Body aches. Denies red flag symptoms. States several of her coworkers recently had the flu. Needs work letter and provided with one.    Patient Active Problem List   Diagnosis Date Noted   Acquired hypothyroidism 03/08/2021   Essential hypertension 03/08/2021   Menopausal flushing 03/08/2021   Tobacco abuse 03/08/2021   Snoring 08/29/2019   Acute on chronic cholecystitis 06/25/2018   G-6-PD deficiency    IBS (irritable bowel syndrome)    Thyroid disease    Bell's palsy    GERD (gastroesophageal reflux disease)      Current Outpatient Medications on File Prior to Visit  Medication Sig Dispense Refill   amoxicillin-clavulanate (AUGMENTIN) 875-125 MG tablet Take 1 tablet by mouth 2 (two) times daily. 20 tablet 0   estradiol (ESTRACE) 1 MG tablet Take 1 mg by mouth daily.     levothyroxine (SYNTHROID) 100 MCG tablet Take 1 tablet (100 mcg total) by mouth daily before breakfast. 30 tablet 2   progesterone (PROMETRIUM) 100 MG capsule Take 100 mg by mouth daily.     tiZANidine (ZANAFLEX) 2 MG tablet Take 2 tablets (4 mg total) by mouth every 8 (eight) hours as needed for muscle spasms. 30 tablet 2    No current facility-administered medications on file prior to visit.    Allergies  Allergen Reactions   Aspirin Nausea Only   Caffeine Nausea Only   Sulfa Antibiotics Nausea And Vomiting    Social History   Socioeconomic History   Marital status: Single    Spouse name: Not on file   Number of children: Not on file   Years of education: Not on file   Highest education level: Not on file  Occupational History   Not on file  Tobacco Use   Smoking status: Former   Smokeless tobacco: Never  Vaping Use   Vaping status: Never Used  Substance and Sexual Activity   Alcohol use: Yes    Comment: occ   Drug use: Yes   Sexual activity: Yes    Birth control/protection: Surgical    Comment: one month ago  Other Topics Concern   Not on file  Social History Narrative   Not on file   Social Drivers of Health   Financial Resource Strain: Low Risk  (01/18/2023)   Overall Financial Resource Strain (CARDIA)    Difficulty of Paying Living Expenses: Not hard at all  Food Insecurity: Not on file  Transportation Needs: Not on file  Physical Activity: Insufficiently Active (01/18/2023)   Exercise Vital Sign    Days of Exercise per Week: 2 days    Minutes of Exercise per Session: 60 min  Stress: No Stress Concern Present (01/18/2023)   [name] of Occupational Health - Occupational Stress  Questionnaire    Feeling of Stress : Only a little  Social Connections: Moderately Isolated (01/18/2023)   Social Connection and Isolation Panel [NHANES]    Frequency of Communication with Friends and Family: More than three times a week    Frequency of Social Gatherings with Friends and Family: Once a week    Attends Religious Services: Never    Database administrator or Organizations: No    Attends Engineer, structural: Never    Marital Status: Living with partner  Catering manager Violence: Not on file    Family History  Problem Relation Age of Onset   Healthy Mother     Healthy Father     Past Surgical History:  Procedure Laterality Date   APPENDECTOMY     CESAREAN SECTION     CHOLECYSTECTOMY  05/31/2018   FOOT SURGERY     OVARIAN CYST REMOVAL     TONSILLECTOMY     TUBAL LIGATION      ROS: Review of Systems Negative except as stated above  PHYSICAL EXAM: BP 123/76   Pulse 67   Temp 98.1 F (36.7 C) (Oral)   Ht 5\' 5"  (1.651 m)   Wt 167 lb 12.8 oz (76.1 kg)   LMP  (LMP Unknown)   SpO2 98%   BMI 27.92 kg/m   Physical Exam HENT:     Head: Normocephalic and atraumatic.     Right Ear: Tympanic membrane, ear canal and external ear normal.     Left Ear: Tympanic membrane, ear canal and external ear normal.     Nose: Nose normal.     Mouth/Throat:     Mouth: Mucous membranes are moist.     Pharynx: Oropharynx is clear.  Eyes:     Extraocular Movements: Extraocular movements intact.     Conjunctiva/sclera: Conjunctivae normal.     Pupils: Pupils are equal, round, and reactive to light.  Cardiovascular:     Rate and Rhythm: Normal rate and regular rhythm.     Pulses: Normal pulses.     Heart sounds: Normal heart sounds.  Pulmonary:     Effort: Pulmonary effort is normal.     Breath sounds: Normal breath sounds.  Musculoskeletal:        General: Normal range of motion.     Cervical back: Normal range of motion and neck supple.  Neurological:     General: No focal deficit present.     Mental Status: She is alert and oriented to person, place, and time.  Psychiatric:        Mood and Affect: Mood normal.        Behavior: Behavior normal.      ASSESSMENT AND PLAN: 1. Primary hypertension (Primary) - Continue Amlodipine and Valsartan as prescribed. - Routine screening.  - Counseled on blood pressure goal of less than 130/80, low-sodium, DASH diet, medication compliance, and 150 minutes of moderate intensity exercise per week as tolerated. Counseled on medication adherence and adverse effects. - Follow-up with primary provider in 3  months or sooner if needed.  - amLODipine (NORVASC) 10 MG tablet; Take 1 tablet (10 mg total) by mouth daily.  Dispense: 90 tablet; Refill: 0 - valsartan (DIOVAN) 40 MG tablet; Take 1 tablet (40 mg total) by mouth daily.  Dispense: 90 tablet; Refill: 0 - Basic Metabolic Panel  2. Hypothyroidism, unspecified type - Referral to Endocrinology for evaluation/management. - Ambulatory referral to Endocrinology  3. Bell's palsy - Continue Gabapentin as prescribed. Counseled on  medication adherence/adverse effects.  - Follow-up with primary provider as scheduled. - gabapentin (NEURONTIN) 300 MG capsule; Take 1 capsule (300 mg total) by mouth 2 (two) times daily.  Dispense: 180 capsule; Refill: 0  4. Hot flashes - Continue Gabapentin as prescribed. Counseled on medication adherence/adverse effects.  - Follow-up with primary provider as scheduled. - gabapentin (NEURONTIN) 300 MG capsule; Take 1 capsule (300 mg total) by mouth 2 (two) times daily.  Dispense: 180 capsule; Refill: 0  5. Gastroesophageal reflux disease, unspecified whether esophagitis present - Omeprazole as prescribed. Counseled on medication adherence/adverse effects.  - Follow-up with primary provider as scheduled. - omeprazole (PRILOSEC) 20 MG capsule; Take 1 capsule (20 mg total) by mouth daily.  Dispense: 90 capsule; Refill: 0  6. Body aches - Patient today in office with no cardiopulmonary/acute distress.  - Routine screening.  - COVID-19, Flu A+B and RSV - POCT rapid strep A; Future - Culture, Group A Strep - POCT rapid strep A    Patient was given the opportunity to ask questions.  Patient verbalized understanding of the plan and was able to repeat key elements of the plan. Patient was given clear instructions to go to Emergency Department or return to medical center if symptoms don't improve, worsen, or new problems develop.The patient verbalized understanding.   Orders Placed This Encounter  Procedures   COVID-19,  Flu A+B and RSV   Culture, Group A Strep   Basic Metabolic Panel   Ambulatory referral to Endocrinology   POCT rapid strep A     Requested Prescriptions   Signed Prescriptions Disp Refills   amLODipine (NORVASC) 10 MG tablet 90 tablet 0    Sig: Take 1 tablet (10 mg total) by mouth daily.   valsartan (DIOVAN) 40 MG tablet 90 tablet 0    Sig: Take 1 tablet (40 mg total) by mouth daily.   gabapentin (NEURONTIN) 300 MG capsule 180 capsule 0    Sig: Take 1 capsule (300 mg total) by mouth 2 (two) times daily.   omeprazole (PRILOSEC) 20 MG capsule 90 capsule 0    Sig: Take 1 capsule (20 mg total) by mouth daily.    Return in about 3 months (around 06/26/2023) for Follow-Up or next available chronic conditions.  Rema Fendt, NP

## 2023-03-30 LAB — COVID-19, FLU A+B AND RSV
Influenza A, NAA: NOT DETECTED
Influenza B, NAA: NOT DETECTED
RSV, NAA: NOT DETECTED
SARS-CoV-2, NAA: NOT DETECTED

## 2023-03-30 LAB — BASIC METABOLIC PANEL
BUN/Creatinine Ratio: 16 (ref 9–23)
BUN: 13 mg/dL (ref 6–24)
CO2: 25 mmol/L (ref 20–29)
Calcium: 10.4 mg/dL — ABNORMAL HIGH (ref 8.7–10.2)
Chloride: 104 mmol/L (ref 96–106)
Creatinine, Ser: 0.82 mg/dL (ref 0.57–1.00)
Glucose: 79 mg/dL (ref 70–99)
Potassium: 4.9 mmol/L (ref 3.5–5.2)
Sodium: 141 mmol/L (ref 134–144)
eGFR: 84 mL/min/{1.73_m2} (ref >59)

## 2023-04-01 LAB — CULTURE, GROUP A STREP

## 2023-04-04 ENCOUNTER — Other Ambulatory Visit: Payer: Self-pay | Admitting: Family

## 2023-04-04 DIAGNOSIS — J020 Streptococcal pharyngitis: Secondary | ICD-10-CM

## 2023-04-04 MED ORDER — AMOXICILLIN-POT CLAVULANATE 875-125 MG PO TABS: 1.0000 | ORAL_TABLET | Freq: Two times a day (BID) | ORAL | 0 refills | Status: AC

## 2023-04-16 ENCOUNTER — Other Ambulatory Visit: Payer: Self-pay | Admitting: Family

## 2023-04-16 DIAGNOSIS — I10 Essential (primary) hypertension: Secondary | ICD-10-CM

## 2023-04-27 ENCOUNTER — Emergency Department (HOSPITAL_COMMUNITY)

## 2023-04-27 ENCOUNTER — Other Ambulatory Visit: Payer: Self-pay

## 2023-04-27 ENCOUNTER — Emergency Department (HOSPITAL_COMMUNITY)
Admission: EM | Admit: 2023-04-27 | Discharge: 2023-04-27 | Disposition: A | Discharge status: Home / Self Care | Payer: Worker's Compensation | Attending: Student | Admitting: Student

## 2023-04-27 ENCOUNTER — Encounter (HOSPITAL_COMMUNITY): Payer: Self-pay

## 2023-04-27 DIAGNOSIS — S161XXA Strain of muscle, fascia and tendon at neck level, initial encounter: Secondary | ICD-10-CM | POA: Diagnosis not present

## 2023-04-27 DIAGNOSIS — M25562 Pain in left knee: Secondary | ICD-10-CM | POA: Diagnosis not present

## 2023-04-27 DIAGNOSIS — Z720 Tobacco use: Secondary | ICD-10-CM | POA: Diagnosis not present

## 2023-04-27 DIAGNOSIS — W240XXA Contact with lifting devices, not elsewhere classified, initial encounter: Secondary | ICD-10-CM | POA: Insufficient documentation

## 2023-04-27 DIAGNOSIS — S39012A Strain of muscle, fascia and tendon of lower back, initial encounter: Secondary | ICD-10-CM | POA: Insufficient documentation

## 2023-04-27 DIAGNOSIS — Y990 Civilian activity done for income or pay: Secondary | ICD-10-CM | POA: Diagnosis not present

## 2023-04-27 DIAGNOSIS — M25532 Pain in left wrist: Secondary | ICD-10-CM | POA: Diagnosis not present

## 2023-04-27 DIAGNOSIS — M542 Cervicalgia: Secondary | ICD-10-CM | POA: Diagnosis present

## 2023-04-27 MED ORDER — METHOCARBAMOL 500 MG PO TABS: 500.0000 mg | ORAL_TABLET | Freq: Two times a day (BID) | ORAL | 0 refills | Status: AC

## 2023-04-27 MED ORDER — LIDOCAINE 5 % EX PTCH: 1.0000 | MEDICATED_PATCH | CUTANEOUS | 0 refills | Status: AC

## 2023-04-27 MED ORDER — METHOCARBAMOL 500 MG PO TABS
500.0000 mg | ORAL_TABLET | Freq: Once | ORAL | Status: AC
  Administered 2023-04-27: 500 mg via ORAL
  Filled 2023-04-27: qty 1

## 2023-04-27 NOTE — ED Provider Notes (Signed)
 [place] EMERGENCY DEPARTMENT AT [facility] Provider Note   CSN: [id number] Arrival date & time: 04/27/23  1027     History  No chief complaint on file.   Robin Franco is a 58 y.o. female with past medical history seen for GERD, G6PD deficiency, tobacco use who presents with concern for left hand pain, left knee pain, neck pain and low back pain after work incident around 1 week ago where someone ran into her with a pallet jack.  Reports she has been taking ibuprofen.   HPI     Home Medications Prior to Admission medications   Medication Sig Start Date End Date Taking? Authorizing Provider  lidocaine (LIDODERM) 5 % Place 1 patch onto the skin daily. Remove & Discard patch within 12 hours or as directed by MD 04/27/23  Yes Krishan Mcbreen H, PA-C  methocarbamol (ROBAXIN) 500 MG tablet Take 1 tablet (500 mg total) by mouth 2 (two) times daily. 04/27/23  Yes Shaniqua Guillot H, PA-C  amLODipine (NORVASC) 10 MG tablet Take 1 tablet (10 mg total) by mouth daily. 03/29/23 04/28/23  Rema Fendt, NP  amoxicillin-clavulanate (AUGMENTIN) 875-125 MG tablet Take 1 tablet by mouth 2 (two) times daily. 04/04/23   Rema Fendt, NP  estradiol (ESTRACE) 1 MG tablet Take 1 mg by mouth daily. 05/01/20   [provider]  gabapentin (NEURONTIN) 300 MG capsule Take 1 capsule (300 mg total) by mouth 2 (two) times daily. 03/29/23 06/27/23  Rema Fendt, NP  levothyroxine (SYNTHROID) 100 MCG tablet Take 1 tablet (100 mcg total) by mouth daily before breakfast. 03/15/23   Rema Fendt, NP  omeprazole (PRILOSEC) 20 MG capsule Take 1 capsule (20 mg total) by mouth daily. 03/29/23   Rema Fendt, NP  progesterone (PROMETRIUM) 100 MG capsule Take 100 mg by mouth daily.    [provider]  tiZANidine (ZANAFLEX) 2 MG tablet Take 2 tablets (4 mg total) by mouth every 8 (eight) hours as needed for muscle spasms. 03/14/23   Rema Fendt, NP  valsartan (DIOVAN) 40 MG tablet  Take 1 tablet (40 mg total) by mouth daily. 03/29/23 06/27/23  Rema Fendt, NP      Allergies    Aspirin, Caffeine, and Sulfa antibiotics    Review of Systems   Review of Systems  All other systems reviewed and are negative.   Physical Exam Updated Vital Signs BP (!) 146/92 (BP Location: Right Arm)   Pulse 65   Temp 98.5 F (36.9 C) (Oral)   Resp 16   Ht 5\' 5"  (1.651 m)   Wt 76 kg   LMP  (LMP Unknown)   SpO2 100%   BMI 27.88 kg/m  Physical Exam Vitals and nursing note reviewed.  Constitutional:      General: She is not in acute distress.    Appearance: Normal appearance.  HENT:     Head: Normocephalic and atraumatic.  Eyes:     General:        Right eye: No discharge.        Left eye: No discharge.  Cardiovascular:     Rate and Rhythm: Normal rate and regular rhythm.     Heart sounds: No murmur heard.    No friction rub. No gallop.  Pulmonary:     Effort: Pulmonary effort is normal.     Breath sounds: Normal breath sounds.  Abdominal:     General: Bowel sounds are normal.     Palpations:  Abdomen is soft.  Musculoskeletal:     Comments: Mild tenderness to palpation of the left wrist, left knee, some tenderness to palpation of the cervical and lumbar paraspinous muscles without significant midline cervical or lumbar paraspinous muscle tenderness.  Intact strength to flexion, extension of bilateral hips 5/5.  Intact strength to flexion, extension, abduction, adduction of bilateral shoulders.  Skin:    General: Skin is warm and dry.     Capillary Refill: Capillary refill takes less than 2 seconds.  Neurological:     Mental Status: She is alert and oriented to person, place, and time.  Psychiatric:        Mood and Affect: Mood normal.        Behavior: Behavior normal.     ED Results / Procedures / Treatments   Labs (all labs ordered are listed, but only abnormal results are displayed) Labs Reviewed - No data to display  EKG None  Radiology CT Cervical  Spine Wo Contrast Result Date: 04/27/2023 CLINICAL DATA:  Neck trauma, dangerous injury mechanism. EXAM: CT CERVICAL SPINE WITHOUT CONTRAST TECHNIQUE: Multidetector CT imaging of the cervical spine was performed without intravenous contrast. Multiplanar CT image reconstructions were also generated. RADIATION DOSE REDUCTION: This exam was performed according to the departmental dose-optimization program which includes automated exposure control, adjustment of the mA and/or kV according to patient size and/or use of iterative reconstruction technique. COMPARISON:  Cervical spine radiograph 06/17/2003 FINDINGS: Alignment: Straightening of the normal cervical lordosis. Trace retrolisthesis of C3 on C4. No facet subluxation or dislocation. Skull base and vertebrae: No compression fracture or displaced fracture in the cervical spine. Degenerative endplate sclerosis at multiple levels. No suspicious osseous lesion. Soft tissues and spinal canal: No prevertebral fluid or swelling. No visible canal hematoma. Disc levels: Mild disc space narrowing at multiple levels. Prominent posterior disc osteophyte complex at C5-6 resulting in moderate spinal canal narrowing. Additional disc osteophyte complex at C6-7 results in mild spinal canal narrowing. Facet arthrosis at multiple levels. Foraminal narrowing most pronounced on the left at C5-6 and C6-7. Upper chest: Paraseptal emphysema in the lung apices. Other: None. IMPRESSION: 1. No acute fracture or traumatic subluxation of the cervical spine. 2. Multilevel cervical spondylosis, most pronounced at C5-6 and C6-7. Electronically Signed   By: Emily Filbert M.D.   On: 04/27/2023 13:17    Procedures Procedures    Medications Ordered in ED Medications  methocarbamol (ROBAXIN) tablet 500 mg (500 mg Oral Given by Other 04/27/23 1152)    ED Course/ Medical Decision Making/ A&P                                 Medical Decision Making Amount and/or Complexity of Data  Reviewed Radiology: ordered.  Risk Prescription drug management.   This patient is a 58 y.o. female who presents to the ED for concern of neck pain, back pain, left knee, and left hand pain after fall.   Differential diagnoses prior to evaluation: Fracture,dislocation, vs muscle strain, vs other soft tissue injury, contusion, meniscus or collateral ligament injury  Past Medical History / Social History / Additional history: Chart reviewed. Pertinent results include: overall noncontributory  Physical Exam: Physical exam performed. The pertinent findings include: Mild tenderness to palpation of the left wrist, left knee, some tenderness to palpation of the cervical and lumbar paraspinous muscles without significant midline cervical or lumbar paraspinous muscle tenderness.  Intact strength to flexion, extension of bilateral  hips 5/5.  Intact strength to flexion, extension, abduction, adduction of bilateral shoulders.  Medications / Treatment: I independently interpreted CT of the lumbar spine, CT of the cervical spine, plain film x-ray of the left hand, and left knee shows no evidence of acute fracture, dislocation, unstable cervical spine injury or other abnormality other than chronic age / degenerative changes.  Findings consistent with cervical, lumbar strain, soft tissue injuries of the knee and the left hand without fracture, dislocation, low clinical suspicion for unstable meniscus or collateral ligament injury.   Disposition: After consideration of the diagnostic results and the patients response to treatment, I feel that patient is stable for discharge with plan as above .   emergency department workup does not suggest an emergent condition requiring admission or immediate intervention beyond what has been performed at this time. The plan is: as above. The patient is safe for discharge and has been instructed to return immediately for worsening symptoms, change in symptoms or any other  concerns.  Final Clinical Impression(s) / ED Diagnoses Final diagnoses:  Strain of neck muscle, initial encounter  Strain of lumbar region, initial encounter  Left wrist pain  Acute pain of left knee    Rx / DC Orders ED Discharge Orders          Ordered    methocarbamol (ROBAXIN) 500 MG tablet  2 times daily        04/27/23 1405    lidocaine (LIDODERM) 5 %  Every 24 hours        04/27/23 1405              Lanise Mergen, Harrel Carina, PA-C 04/27/23 1416    Glendora Score, MD 04/27/23 [phone]

## 2023-04-27 NOTE — Discharge Instructions (Signed)

## 2023-05-26 ENCOUNTER — Other Ambulatory Visit: Payer: BC Managed Care – PPO

## 2023-05-30 ENCOUNTER — Ambulatory Visit: Payer: BC Managed Care – PPO | Admitting: "Endocrinology

## 2023-06-08 ENCOUNTER — Other Ambulatory Visit: Payer: Self-pay | Admitting: Orthopedic Surgery

## 2023-06-08 DIAGNOSIS — M25562 Pain in left knee: Secondary | ICD-10-CM

## 2023-06-10 ENCOUNTER — Ambulatory Visit
Admission: RE | Admit: 2023-06-10 | Discharge: 2023-06-10 | Disposition: A | Discharge status: Home / Self Care | Payer: Self-pay | Source: Ambulatory Visit | Attending: Orthopedic Surgery | Admitting: Orthopedic Surgery

## 2023-06-10 DIAGNOSIS — M25562 Pain in left knee: Secondary | ICD-10-CM

## 2023-06-26 ENCOUNTER — Encounter: Payer: BC Managed Care – PPO | Admitting: Family

## 2023-06-26 NOTE — Progress Notes (Signed)
 Erroneous encounter-disregard

## 2023-07-06 ENCOUNTER — Ambulatory Visit: Admitting: "Endocrinology

## 2023-07-06 ENCOUNTER — Other Ambulatory Visit: Payer: Self-pay

## 2023-07-06 ENCOUNTER — Encounter: Payer: Self-pay | Admitting: "Endocrinology

## 2023-07-06 VITALS — BP 130/80 | HR 88 | Ht 65.0 in | Wt 175.0 lb

## 2023-07-06 DIAGNOSIS — E039 Hypothyroidism, unspecified: Secondary | ICD-10-CM

## 2023-07-06 DIAGNOSIS — Z8639 Personal history of other endocrine, nutritional and metabolic disease: Secondary | ICD-10-CM

## 2023-07-06 NOTE — Progress Notes (Signed)
 Outpatient Endocrinology Note Jorge Newcomer, MD  07/06/23   Robin Franco 1965/07/29 [id number]  Referring Provider: Senaida Dama, NP Primary Care Provider: Senaida Dama, NP Subjective  No chief complaint on file.   Assessment & Plan  Diagnoses and all orders for this visit:  Acquired hypothyroidism -     TSH + free T4 -     TRAb (TSH Receptor Binding Antibody) -     Thyroid  stimulating immunoglobulin  History of Graves' disease -     TSH + free T4 -     TRAb (TSH Receptor Binding Antibody) -     Thyroid  stimulating immunoglobulin    Robin Franco was taking levothyroxine  88mcg every day with other medications, ran out a month ago. Patient was biochemically hypothyroid on last check.  Educated on thyroid  axis.  Recommend the following: labs today.  Advised to take levothyroxine  first thing in the morning on empty stomach and wait at least 30 minutes to 1 hour before eating or drinking anything or taking any other medications. Space out levothyroxine  by 4 hours from any acid reflux medication/fibrate/iron/calcium/multivitamin. Advised to take nutritional supplements in the evening. Repeat lab before next visit or sooner if symptoms of hyperthyroidism or hypothyroidism develop.  Notify us  immediately in case of significant weight gain or loss. Counseled on compliance and follow up needs.  C/o B/L eye proptosis L>R, water eyes, blurry vision and pain behind the eyes sometimes  Plans to follow up with ophthalmologist for "graves eye disease" as she was told previously by another provider  Ordered antibodies for graves   The substances in cigarettes and possibly in vaping products can affect the autoimmune processes underlying Graves' disease The patient was counseled on the dangers of tobacco use, and was advised to cut. Reviewed strategies to maximize success, including support of family/friends, written materials and local smoking cessation programs. Encouraged to  contact PCP for any help. Patient understands and agreed   I have reviewed current medications, nurse's notes, allergies, vital signs, past medical and surgical history, family medical history, and social history for this encounter. Counseled patient on symptoms, examination findings, lab findings, imaging results, treatment decisions and monitoring and prognosis. The patient understood the recommendations and agrees with the treatment plan. All questions regarding treatment plan were fully answered.   Return in about 3 months (around 10/06/2023) for visit + labs before next visit, labs today.   Jorge Newcomer, MD  07/06/23   I have reviewed current medications, nurse's notes, allergies, vital signs, past medical and surgical history, family medical history, and social history for this encounter. Counseled patient on symptoms, examination findings, lab findings, imaging results, treatment decisions and monitoring and prognosis. The patient understood the recommendations and agrees with the treatment plan. All questions regarding treatment plan were fully answered.   History of Present Illness Robin Franco is a 58 y.o. year old female who presents to our clinic with thyroid  diease/graves diagnosed in 2021.    Diagnosed of graves diseases around 2021 in ER.   Symptoms suggestive of HYPOTHYROIDISM:  fatigue Yes weight gain Yes cold intolerance  No constipation  No  Symptoms suggestive of HYPERTHYROIDISM:  weight loss  No heat intolerance Yes hyperdefecation  No palpitations  No  Compressive symptoms:  dysphagia  No dysphonia  Yes positional dyspnea (especially with simultaneous arms elevation)  No  Smokes  Yes On biotin  No Personal history of head/neck surgery/irradiation  No  C/o B/L eye proptosis L>R, water eyes,  blurry vision and pain behind the eyes sometimes  Plans to follow up with ophthalmologist for "graves eye disease" as she was told previously by another provider   Ordered antibodies for graves   Physical Exam  BP 130/80   Pulse 88   Ht 5\' 5"  (1.651 m)   Wt 175 lb (79.4 kg)   LMP  (LMP Unknown)   SpO2 98%   BMI 29.12 kg/m  Constitutional: well developed, well nourished Head: normocephalic, atraumatic, + exophthalmos L>R Eyes: sclera anicteric, no redness Neck: no thyromegaly, no thyroid  tenderness; no nodules palpated Lungs: normal respiratory effort Neurology: alert and oriented, no fine hand tremor Skin: dry, no appreciable rashes Musculoskeletal: no appreciable defects Psychiatric: normal mood and affect  Allergies Allergies  Allergen Reactions   Aspirin Nausea Only   Caffeine Nausea Only   Sulfa  Antibiotics Nausea And Vomiting    Current Medications Patient's Medications  New Prescriptions   No medications on file  Previous Medications   AMLODIPINE  (NORVASC ) 10 MG TABLET    Take 1 tablet (10 mg total) by mouth daily.   AMOXICILLIN -CLAVULANATE (AUGMENTIN ) 875-125 MG TABLET    Take 1 tablet by mouth 2 (two) times daily.   ESTRADIOL (ESTRACE) 1 MG TABLET    Take 1 mg by mouth daily.   GABAPENTIN  (NEURONTIN ) 300 MG CAPSULE    Take 1 capsule (300 mg total) by mouth 2 (two) times daily.   LEVOTHYROXINE  (SYNTHROID ) 100 MCG TABLET    Take 1 tablet (100 mcg total) by mouth daily before breakfast.   LIDOCAINE  (LIDODERM ) 5 %    Place 1 patch onto the skin daily. Remove & Discard patch within 12 hours or as directed by MD   METHOCARBAMOL  (ROBAXIN ) 500 MG TABLET    Take 1 tablet (500 mg total) by mouth 2 (two) times daily.   OMEPRAZOLE  (PRILOSEC) 20 MG CAPSULE    Take 1 capsule (20 mg total) by mouth daily.   PROGESTERONE (PROMETRIUM) 100 MG CAPSULE    Take 100 mg by mouth daily.   TIZANIDINE  (ZANAFLEX ) 2 MG TABLET    Take 2 tablets (4 mg total) by mouth every 8 (eight) hours as needed for muscle spasms.   VALSARTAN  (DIOVAN ) 40 MG TABLET    Take 1 tablet (40 mg total) by mouth daily.  Modified Medications   No medications on file   Discontinued Medications   No medications on file    Past Medical History Past Medical History:  Diagnosis Date   Bell's palsy    G-6-PD deficiency    GERD (gastroesophageal reflux disease)    Hypertension    IBS (irritable bowel syndrome)    Ovarian cyst    Seasonal allergies    Thyroid  disease    HYPER    Past Surgical History Past Surgical History:  Procedure Laterality Date   APPENDECTOMY     CESAREAN SECTION     CHOLECYSTECTOMY  05/31/2018   FOOT SURGERY     OVARIAN CYST REMOVAL     TONSILLECTOMY     TUBAL LIGATION      Family History family history includes Healthy in her father and mother.  Social History Social History   Socioeconomic History   Marital status: Single    Spouse name: Not on file   Number of children: Not on file   Years of education: Not on file   Highest education level: Not on file  Occupational History   Not on file  Tobacco Use   Smoking status:  Former   Smokeless tobacco: Never  Advertising account planner   Vaping status: Never Used  Substance and Sexual Activity   Alcohol use: Yes    Comment: occ   Drug use: Yes   Sexual activity: Yes    Birth control/protection: Surgical    Comment: one month ago  Other Topics Concern   Not on file  Social History Narrative   Not on file   Social Drivers of Health   Financial Resource Strain: Low Risk  (01/18/2023)   Overall Financial Resource Strain (CARDIA)    Difficulty of Paying Living Expenses: Not hard at all  Food Insecurity: Not on file  Transportation Needs: Not on file  Physical Activity: Insufficiently Active (01/18/2023)   Exercise Vital Sign    Days of Exercise per Week: 2 days    Minutes of Exercise per Session: 60 min  Stress: No Stress Concern Present (01/18/2023)   [name] of Occupational Health - Occupational Stress Questionnaire    Feeling of Stress : Only a little  Social Connections: Moderately Isolated (01/18/2023)   Social Connection and Isolation Panel [NHANES]     Frequency of Communication with Friends and Family: More than three times a week    Frequency of Social Gatherings with Friends and Family: Once a week    Attends Religious Services: Never    Database administrator or Organizations: No    Attends Banker Meetings: Never    Marital Status: Living with partner  Intimate Partner Violence: Not on file    Laboratory Investigations Lab Results  Component Value Date   TSH 4.690 (H) 03/14/2023   TSH 12.000 (H) 01/18/2023   TSH 7.134 (H) 02/16/2018   FREET4 0.84 01/20/2016     No results found for: "TSI"   No components found for: "TRAB"   No results found for: "CHOL" No results found for: "HDL" No results found for: "LDLCALC" No results found for: "TRIG" No results found for: "CHOLHDL" Lab Results  Component Value Date   CREATININE 0.82 03/29/2023   No results found for: "GFR"    Component Value Date/Time   NA 141 03/29/2023 1015   K 4.9 03/29/2023 1015   CL 104 03/29/2023 1015   CO2 25 03/29/2023 1015   GLUCOSE 79 03/29/2023 1015   GLUCOSE 89 10/31/2021 0613   BUN 13 03/29/2023 1015   CREATININE 0.82 03/29/2023 1015   CALCIUM 10.4 (H) 03/29/2023 1015   PROT 8.2 (H) 10/31/2021 0613   ALBUMIN 4.5 10/31/2021 0613   AST 19 10/31/2021 0613   ALT 15 10/31/2021 0613   ALKPHOS 88 10/31/2021 0613   BILITOT 0.7 10/31/2021 0613   GFRNONAA >60 10/31/2021 0613   GFRAA >60 06/19/2018 0741      Latest Ref Rng & Units 03/29/2023   10:15 AM 03/14/2023    3:27 PM 01/18/2023    3:37 PM  BMP  Glucose 70 - 99 mg/dL 79  85  595   BUN 6 - 24 mg/dL 13  16  18    Creatinine 0.57 - 1.00 mg/dL 6.38  7.56  4.33   BUN/Creat Ratio 9 - 23 16  22  16    Sodium 134 - 144 mmol/L 141  140  139   Potassium 3.5 - 5.2 mmol/L 4.9  4.5  4.7   Chloride 96 - 106 mmol/L 104  104  105   CO2 20 - 29 mmol/L 25  23  22    Calcium 8.7 - 10.2 mg/dL 10.4  10.2  9.7        Component Value Date/Time   WBC 7.6 10/31/2021 0613   RBC 5.01 10/31/2021  0613   HGB 12.7 10/31/2021 0613   HCT 41.4 10/31/2021 0613   PLT 344 10/31/2021 0613   MCV 82.6 10/31/2021 0613   MCH 25.3 (L) 10/31/2021 0613   MCHC 30.7 10/31/2021 0613   RDW 14.7 10/31/2021 0613   LYMPHSABS 2.7 10/31/2021 0613   MONOABS 0.5 10/31/2021 0613   EOSABS 0.3 10/31/2021 0613   BASOSABS 0.1 10/31/2021 1610      Parts of this note may have been dictated using voice recognition software. There may be variances in spelling and vocabulary which are unintentional. Not all errors are proofread. Please notify the [facility] if any discrepancies are noted or if the meaning of any statement is not clear.

## 2023-07-11 ENCOUNTER — Other Ambulatory Visit: Payer: Self-pay | Admitting: "Endocrinology

## 2023-07-11 ENCOUNTER — Ambulatory Visit: Payer: Self-pay | Admitting: "Endocrinology

## 2023-07-11 DIAGNOSIS — E039 Hypothyroidism, unspecified: Secondary | ICD-10-CM

## 2023-07-11 LAB — THYROID STIMULATING IMMUNOGLOBULIN: TSI: 110 %{baseline} (ref <140)

## 2023-07-11 LAB — TRAB (TSH RECEPTOR BINDING ANTIBODY): TRAB: <1 IU/L (ref <=2.00)

## 2023-07-11 LAB — T4, FREE: Free T4: 0.7 ng/dL — ABNORMAL LOW (ref 0.8–1.8)

## 2023-07-11 LAB — TSH+FREE T4: TSH W/REFLEX TO FT4: 11.87 m[IU]/L — ABNORMAL HIGH (ref 0.40–4.50)

## 2023-07-11 MED ORDER — LEVOTHYROXINE SODIUM 100 MCG PO TABS: 100.0000 ug | ORAL_TABLET | Freq: Every day | ORAL | 1 refills | Status: DC

## 2023-08-23 ENCOUNTER — Other Ambulatory Visit: Payer: Self-pay | Admitting: Orthopedic Surgery

## 2023-08-23 DIAGNOSIS — M545 Low back pain, unspecified: Secondary | ICD-10-CM

## 2023-08-31 ENCOUNTER — Ambulatory Visit
Admission: RE | Admit: 2023-08-31 | Discharge: 2023-08-31 | Disposition: A | Discharge status: Home / Self Care | Payer: Worker's Compensation | Source: Ambulatory Visit | Attending: Orthopedic Surgery | Admitting: Orthopedic Surgery

## 2023-08-31 DIAGNOSIS — M545 Low back pain, unspecified: Secondary | ICD-10-CM

## 2023-09-19 ENCOUNTER — Telehealth: Payer: Self-pay | Admitting: Family

## 2023-09-19 NOTE — Telephone Encounter (Signed)
 A document form from [facility] has been faxed: records from 04/21/2022 to present. Send document back via Fax within 7-days. Document is located in providers tray at front office.          Fax number: [phone]

## 2023-10-04 ENCOUNTER — Other Ambulatory Visit

## 2023-10-05 NOTE — Telephone Encounter (Signed)
 Forms have been send to medical records

## 2023-10-06 ENCOUNTER — Ambulatory Visit: Admitting: "Endocrinology

## 2023-12-09 ENCOUNTER — Encounter (HOSPITAL_COMMUNITY): Payer: Self-pay | Admitting: Emergency Medicine

## 2023-12-09 ENCOUNTER — Emergency Department (HOSPITAL_COMMUNITY): Payer: Self-pay

## 2023-12-09 ENCOUNTER — Other Ambulatory Visit: Payer: Self-pay

## 2023-12-09 ENCOUNTER — Emergency Department (HOSPITAL_COMMUNITY)
Admission: EM | Admit: 2023-12-09 | Discharge: 2023-12-09 | Disposition: A | Discharge status: Home / Self Care | Payer: Self-pay | Attending: Emergency Medicine | Admitting: Emergency Medicine

## 2023-12-09 DIAGNOSIS — Z79899 Other long term (current) drug therapy: Secondary | ICD-10-CM | POA: Insufficient documentation

## 2023-12-09 DIAGNOSIS — G510 Bell's palsy: Secondary | ICD-10-CM | POA: Insufficient documentation

## 2023-12-09 LAB — CBC WITH DIFFERENTIAL/PLATELET
Abs Immature Granulocytes: 0.01 K/uL (ref 0.00–0.07)
Basophils Absolute: 0.1 K/uL (ref 0.0–0.1)
Basophils Relative: 1 %
Eosinophils Absolute: 0.3 K/uL (ref 0.0–0.5)
Eosinophils Relative: 4 %
HCT: 43.3 % (ref 36.0–46.0)
Hemoglobin: 12.7 g/dL (ref 12.0–15.0)
Immature Granulocytes: 0 %
Lymphocytes Relative: 42 %
Lymphs Abs: 3.2 K/uL (ref 0.7–4.0)
MCH: 24.6 pg — ABNORMAL LOW (ref 26.0–34.0)
MCHC: 29.3 g/dL — ABNORMAL LOW (ref 30.0–36.0)
MCV: 83.9 fL (ref 80.0–100.0)
Monocytes Absolute: 0.6 K/uL (ref 0.1–1.0)
Monocytes Relative: 8 %
Neutro Abs: 3.4 K/uL (ref 1.7–7.7)
Neutrophils Relative %: 45 %
Platelets: 366 K/uL (ref 150–400)
RBC: 5.16 MIL/uL — ABNORMAL HIGH (ref 3.87–5.11)
RDW: 14.8 % (ref 11.5–15.5)
WBC: 7.5 K/uL (ref 4.0–10.5)
nRBC: 0 % (ref 0.0–0.2)

## 2023-12-09 LAB — COMPREHENSIVE METABOLIC PANEL WITH GFR
ALT: 12 U/L (ref 0–44)
AST: 23 U/L (ref 15–41)
Albumin: 4.7 g/dL (ref 3.5–5.0)
Alkaline Phosphatase: 138 U/L — ABNORMAL HIGH (ref 38–126)
Anion gap: 10 (ref 5–15)
BUN: 13 mg/dL (ref 6–20)
CO2: 24 mmol/L (ref 22–32)
Calcium: 10.5 mg/dL — ABNORMAL HIGH (ref 8.9–10.3)
Chloride: 104 mmol/L (ref 98–111)
Creatinine, Ser: 0.93 mg/dL (ref 0.44–1.00)
GFR, Estimated: >60 mL/min (ref >60)
Glucose, Bld: 98 mg/dL (ref 70–99)
Potassium: 3.9 mmol/L (ref 3.5–5.1)
Sodium: 138 mmol/L (ref 135–145)
Total Bilirubin: 0.7 mg/dL (ref 0.0–1.2)
Total Protein: 8 g/dL (ref 6.5–8.1)

## 2023-12-09 LAB — URINE DRUG SCREEN
Amphetamines: NEGATIVE
Barbiturates: NEGATIVE
Benzodiazepines: NEGATIVE
Cocaine: NEGATIVE
Fentanyl: NEGATIVE
Methadone Scn, Ur: NEGATIVE
Opiates: NEGATIVE
Tetrahydrocannabinol: POSITIVE — AB

## 2023-12-09 LAB — URINALYSIS, W/ REFLEX TO CULTURE (INFECTION SUSPECTED)
Bacteria, UA: NONE SEEN
Bilirubin Urine: NEGATIVE
Glucose, UA: NEGATIVE mg/dL
Ketones, ur: NEGATIVE mg/dL
Leukocytes,Ua: NEGATIVE
Nitrite: NEGATIVE
Protein, ur: 30 mg/dL — AB
RBC / HPF: >50 RBC/hpf (ref 0–5)
Specific Gravity, Urine: 1.025 (ref 1.005–1.030)
pH: 5 (ref 5.0–8.0)

## 2023-12-09 LAB — PROTIME-INR
INR: 1 (ref 0.8–1.2)
Prothrombin Time: 13.3 s (ref 11.4–15.2)

## 2023-12-09 MED ORDER — PREDNISONE 10 MG PO TABS: ORAL_TABLET | ORAL | 0 refills | Status: DC

## 2023-12-09 MED ORDER — PREDNISONE 20 MG PO TABS
40.0000 mg | ORAL_TABLET | Freq: Once | ORAL | Status: AC
  Administered 2023-12-09: 40 mg via ORAL
  Filled 2023-12-09: qty 2

## 2023-12-09 MED ORDER — ACYCLOVIR 400 MG PO TABS: 400.0000 mg | ORAL_TABLET | Freq: Four times a day (QID) | ORAL | 0 refills | Status: AC

## 2023-12-09 MED ORDER — HYPROMELLOSE (GONIOSCOPIC) 2.5 % OP SOLN: 1.0000 [drp] | Freq: Four times a day (QID) | OPHTHALMIC | 12 refills | Status: DC | PRN

## 2023-12-09 MED ORDER — ACYCLOVIR 200 MG PO CAPS
400.0000 mg | ORAL_CAPSULE | Freq: Once | ORAL | Status: AC
  Administered 2023-12-09: 400 mg via ORAL
  Filled 2023-12-09: qty 2

## 2023-12-09 MED ORDER — ARTIFICIAL TEARS OPHTHALMIC OINT: TOPICAL_OINTMENT | OPHTHALMIC | 1 refills | Status: AC | PRN

## 2023-12-09 MED ORDER — ARTIFICIAL TEARS OPHTHALMIC OINT: TOPICAL_OINTMENT | OPHTHALMIC | 1 refills | Status: DC | PRN

## 2023-12-09 MED ORDER — HYPROMELLOSE (GONIOSCOPIC) 2.5 % OP SOLN: 1.0000 [drp] | Freq: Four times a day (QID) | OPHTHALMIC | 12 refills | Status: AC | PRN

## 2023-12-09 MED ORDER — IOHEXOL 350 MG/ML SOLN
80.0000 mL | Freq: Once | INTRAVENOUS | Status: AC | PRN
  Administered 2023-12-09: 80 mL via INTRAVENOUS

## 2023-12-09 MED ORDER — ACYCLOVIR 400 MG PO TABS: 400.0000 mg | ORAL_TABLET | Freq: Four times a day (QID) | ORAL | 0 refills | Status: DC

## 2023-12-09 MED ORDER — GADOBUTROL 1 MMOL/ML IV SOLN
7.0000 mL | Freq: Once | INTRAVENOUS | Status: AC | PRN
  Administered 2023-12-09: 7 mL via INTRAVENOUS

## 2023-12-09 MED ORDER — PREDNISONE 10 MG PO TABS: ORAL_TABLET | ORAL | 0 refills | Status: AC

## 2023-12-09 NOTE — ED Provider Notes (Signed)
 [place] EMERGENCY DEPARTMENT AT [facility] Provider Note   CSN: [id number] Arrival date & time: 12/09/23  0945     Patient presents with: Facial Droop   Robin Franco is a 58 y.o. female.   58 year old female with prior medical history as detailed below presents for evaluation.  Patient complains of left-sided facial droop.  Patient complains of tingling numbness in her arms bilaterally.  She complains of slurred speech.  She reports prior history of Bell's palsy.  She reports that when she does not take her gabapentin , her Bell's palsy recurs.  She reports onset of symptoms more than 24 hours ago.  She noticed symptoms initially on Friday morning after going to bed normal on Thursday evening.  She reports that she has not been taking any medicines for the last several weeks secondary to no funds for the medicines.  She has an appointment next week with her PCP for reinitiation of medication.  The history is provided by the patient and medical records.       Prior to Admission medications   Medication Sig Start Date End Date Taking? Authorizing Provider  amLODipine  (NORVASC ) 10 MG tablet Take 1 tablet (10 mg total) by mouth daily. 03/29/23 04/28/23  Lorren Greig PARAS, NP  amoxicillin -clavulanate (AUGMENTIN ) 875-125 MG tablet Take 1 tablet by mouth 2 (two) times daily. 04/04/23   Lorren Greig PARAS, NP  estradiol (ESTRACE) 1 MG tablet Take 1 mg by mouth daily. 05/01/20   [provider]  gabapentin  (NEURONTIN ) 300 MG capsule Take 1 capsule (300 mg total) by mouth 2 (two) times daily. 03/29/23 06/27/23  Lorren Greig PARAS, NP  levothyroxine  (SYNTHROID ) 100 MCG tablet Take 1 tablet (100 mcg total) by mouth daily before breakfast. 07/11/23 10/09/23  Dartha Ernst, MD  lidocaine  (LIDODERM ) 5 % Place 1 patch onto the skin daily. Remove & Discard patch within 12 hours or as directed by MD 04/27/23   Prosperi, Sherlean H, PA-C  methocarbamol  (ROBAXIN ) 500 MG tablet Take 1 tablet (500  mg total) by mouth 2 (two) times daily. 04/27/23   Prosperi, Christian H, PA-C  omeprazole  (PRILOSEC) 20 MG capsule Take 1 capsule (20 mg total) by mouth daily. 03/29/23   Lorren Greig PARAS, NP  progesterone (PROMETRIUM) 100 MG capsule Take 100 mg by mouth daily.    [provider]  tiZANidine  (ZANAFLEX ) 2 MG tablet Take 2 tablets (4 mg total) by mouth every 8 (eight) hours as needed for muscle spasms. 03/14/23   Lorren Greig PARAS, NP  valsartan  (DIOVAN ) 40 MG tablet Take 1 tablet (40 mg total) by mouth daily. 03/29/23 06/27/23  Lorren Greig PARAS, NP    Allergies: Aspirin, Caffeine, and Sulfa  antibiotics    Review of Systems  All other systems reviewed and are negative.   Updated Vital Signs BP (!) 186/93   Pulse 97   Temp 99.4 F (37.4 C) (Oral)   Resp 16   LMP  (LMP Unknown)   SpO2 100%   Physical Exam Vitals and nursing note reviewed.  Constitutional:      General: She is not in acute distress.    Appearance: Normal appearance. She is well-developed.  HENT:     Head: Normocephalic and atraumatic.  Eyes:     Conjunctiva/sclera: Conjunctivae normal.  Cardiovascular:     Rate and Rhythm: Normal rate and regular rhythm.     Heart sounds: No murmur heard. Pulmonary:     Effort: Pulmonary effort is normal. No respiratory distress.  Breath sounds: Normal breath sounds.  Abdominal:     Palpations: Abdomen is soft.     Tenderness: There is no abdominal tenderness.  Musculoskeletal:        General: No swelling.     Cervical back: Neck supple.  Skin:    General: Skin is warm and dry.     Capillary Refill: Capillary refill takes less than 2 seconds.  Neurological:     Mental Status: She is alert and oriented to person, place, and time.     Comments: Alert, oriented x 3, normal speech, right facial droop appreciated.  Distal bilateral upper and lower extremities have 5 out of 5 strength.  Patient is ambulatory without difficulty.  Psychiatric:        Mood and Affect: Mood  normal.     (all labs ordered are listed, but only abnormal results are displayed) Labs Reviewed  CBC WITH DIFFERENTIAL/PLATELET  COMPREHENSIVE METABOLIC PANEL WITH GFR  PROTIME-INR  URINALYSIS, W/ REFLEX TO CULTURE (INFECTION SUSPECTED)  URINE DRUG SCREEN    EKG: None  Radiology: No results found.   Procedures   Medications Ordered in the ED - No data to display                                  Medical Decision Making Patient is presenting with a right sided facial droop.  Patient reports prior history of Bell's palsy.  Her last episode of Bell's palsy was approximately 5 years ago.  Symptoms reported today are consistent with prior episodes of Bell's palsy.  Fortunately imaging including CT and MRI do not suggest other pathology such as stroke.  Obtained labs are otherwise without acute abnormality.  Patient has already established appointment with her PCP next week.  Patient understands treatment of Bell's palsy given that she has experienced this before.  Patient provided with prescriptions for prednisone , acyclovir, artificial tears.  Importance of close follow-up is stressed.  Strict return precautions given and understood.  Amount and/or Complexity of Data Reviewed Labs: ordered. Radiology: ordered.  Risk OTC drugs. Prescription drug management.        Final diagnoses:  Bell's palsy    ED Discharge Orders          Ordered    acyclovir (ZOVIRAX) 400 MG tablet  4 times daily        12/09/23 1437    predniSONE  (DELTASONE ) 10 MG tablet  Daily        12/09/23 1437    artificial tears (LACRILUBE) OINT ophthalmic ointment  Every 4 hours PRN        12/09/23 1437    hydroxypropyl methylcellulose / hypromellose (ISOPTO TEARS / GONIOVISC) 2.5 % ophthalmic solution  4 times daily PRN        12/09/23 1437               Laurice Maude BROCKS, MD 12/09/23 1438

## 2023-12-09 NOTE — ED Triage Notes (Signed)
 Pt reports slurred speech, facial droop, headaches, right arm numbness, and pain. Pt reports that the facial droop and slurred speech started when she woke up Friday morning. She went to bed normal on Thursday night.

## 2023-12-09 NOTE — Discharge Instructions (Signed)
 Return for any problem.  ?

## 2023-12-14 ENCOUNTER — Ambulatory Visit: Admission: EM | Admit: 2023-12-14 | Discharge: 2023-12-14 | Disposition: A | Discharge status: Home / Self Care

## 2023-12-14 ENCOUNTER — Encounter: Payer: Self-pay | Admitting: Emergency Medicine

## 2023-12-14 DIAGNOSIS — G510 Bell's palsy: Secondary | ICD-10-CM | POA: Diagnosis not present

## 2023-12-14 DIAGNOSIS — E039 Hypothyroidism, unspecified: Secondary | ICD-10-CM

## 2023-12-14 DIAGNOSIS — K219 Gastro-esophageal reflux disease without esophagitis: Secondary | ICD-10-CM

## 2023-12-14 DIAGNOSIS — I10 Essential (primary) hypertension: Secondary | ICD-10-CM

## 2023-12-14 DIAGNOSIS — Z760 Encounter for issue of repeat prescription: Secondary | ICD-10-CM

## 2023-12-14 DIAGNOSIS — R232 Flushing: Secondary | ICD-10-CM

## 2023-12-14 MED ORDER — VALSARTAN 40 MG PO TABS: 40.0000 mg | ORAL_TABLET | Freq: Every day | ORAL | 0 refills | Status: AC

## 2023-12-14 MED ORDER — OMEPRAZOLE 20 MG PO CPDR
20.0000 mg | DELAYED_RELEASE_CAPSULE | Freq: Every day | ORAL | 0 refills | Status: AC
Start: 2023-12-14 — End: ?

## 2023-12-14 MED ORDER — GABAPENTIN 300 MG PO CAPS: 300.0000 mg | ORAL_CAPSULE | Freq: Two times a day (BID) | ORAL | 1 refills | Status: AC

## 2023-12-14 MED ORDER — LEVOTHYROXINE SODIUM 100 MCG PO TABS: 100.0000 ug | ORAL_TABLET | Freq: Every day | ORAL | 1 refills | Status: AC

## 2023-12-14 MED ORDER — AMLODIPINE BESYLATE 10 MG PO TABS: 10.0000 mg | ORAL_TABLET | Freq: Every day | ORAL | 1 refills | Status: AC

## 2023-12-14 NOTE — Discharge Instructions (Signed)
 Medication Refill  1. Primary hypertension (Primary) - amLODipine  (NORVASC ) 10 MG tablet; Take 1 tablet (10 mg total) by mouth daily.  Dispense: 90 tablet; Refill: 1 - valsartan  (DIOVAN ) 40 MG tablet; Take 1 tablet (40 mg total) by mouth daily.  Dispense: 90 tablet; Refill: 0  2. Gastroesophageal reflux disease, unspecified whether esophagitis present - omeprazole  (PRILOSEC) 20 MG capsule; Take 1 capsule (20 mg total) by mouth daily.  Dispense: 90 capsule; Refill: 0  3. Hypothyroidism, unspecified type - levothyroxine  (SYNTHROID ) 100 MCG tablet; Take 1 tablet (100 mcg total) by mouth daily before breakfast.  Dispense: 90 tablet; Refill: 1  4. Bell's palsy - gabapentin  (NEURONTIN ) 300 MG capsule; Take 1 capsule (300 mg total) by mouth 2 (two) times daily.  Dispense: 180 capsule; Refill: 1

## 2023-12-14 NOTE — ED Triage Notes (Addendum)
 Pt here with concerns regarding hypertension. Reports she was seen at ED on 10/25 for high blood pressure, blurred vision, slurring of words, and jitteriness. Diagnosed with bell's palsy. Provided prednisone  10mg  and acyclovir. Pt reports no improvement in symptoms. She has been off her blood pressure (amlodipine  10mg ), gabapentin , and thyroid  medicine for 6 months. Pt reports she has tried to schedule appt next door, but couldn't due to a owed bill. BP: 177/101 in triage

## 2023-12-14 NOTE — ED Provider Notes (Signed)
 [facility]  Note:  This document was prepared using Conservation officer, historic buildings and may include unintentional dictation errors.  MRN: [id number] DOB: 1965-04-25  Subjective:   Robin Franco is a 58 y.o. female presenting for evaluation of persistent hypertension and refill of medications for hypertension, Bell's palsy, hypothyroidism.  Patient reports that she has been dealing with high blood pressure since 12/09/2023.  Was seen in the emergency department and CVA workup completed due to history of Bell's palsy with mildly slurred speech and jitteriness.  Patient reports that she has had Bell's palsy for 15 years and has been monitored by her physician for this throughout that whole time.  Patient was prescribed prednisone  and acyclovir for treatment of Bell's palsy with minimal improvement.  Patient reports that she has been off of her blood pressure medication her gabapentin  and levothyroxine  for approximately 6 months.  Patient tried to schedule an appointment with her primary care provider but was unable to get an appointment due to outstanding balance on her account.  Patient denies any shortness of breath, chest pain, unilateral weakness, dizziness, generalized weakness, fatigue.  No current facility-administered medications for this encounter.  Current Outpatient Medications:    acetaminophen  (TYLENOL ) 325 MG tablet, Take 650 mg by mouth., Disp: , Rfl:    acyclovir (ZOVIRAX) 400 MG tablet, Take 1 tablet (400 mg total) by mouth 4 (four) times daily., Disp: 50 tablet, Rfl: 0   ibuprofen  (ADVIL ) 800 MG tablet, Take 800 mg by mouth 3 (three) times daily., Disp: , Rfl:    predniSONE  (DELTASONE ) 10 MG tablet, Take 4 tablets (40 mg total) by mouth daily for 4 days, THEN 3 tablets (30 mg total) daily for 4 days, THEN 2 tablets (20 mg total) daily for 4 days, THEN 1 tablet (10 mg total) daily for 4 days, THEN 0.5 tablets (5 mg total) daily for 4 days., Disp: 42 tablet, Rfl: 0    amLODipine  (NORVASC ) 10 MG tablet, Take 1 tablet (10 mg total) by mouth daily., Disp: 90 tablet, Rfl: 1   amoxicillin -clavulanate (AUGMENTIN ) 875-125 MG tablet, Take 1 tablet by mouth 2 (two) times daily. (Patient not taking: Reported on 12/14/2023), Disp: 20 tablet, Rfl: 0   artificial tears (LACRILUBE) OINT ophthalmic ointment, Place into both eyes every 4 (four) hours as needed for dry eyes. (Patient not taking: Reported on 12/14/2023), Disp: 3.5 g, Rfl: 1   celecoxib (CELEBREX) 200 MG capsule, Take 200 mg by mouth 2 (two) times daily. (Patient not taking: Reported on 12/14/2023), Disp: , Rfl:    diclofenac Sodium (VOLTAREN) 1 % GEL, SMARTSIG:4 Gram(s) Topical 3 Times Daily (Patient not taking: Reported on 12/14/2023), Disp: , Rfl:    estradiol (ESTRACE) 1 MG tablet, Take 1 mg by mouth daily. (Patient not taking: Reported on 12/14/2023), Disp: , Rfl:    estradiol (VIVELLE-DOT) 0.075 MG/24HR, Apply 1 patch(es) twice a week by transdermal route. (Patient not taking: Reported on 12/14/2023), Disp: , Rfl:    gabapentin  (NEURONTIN ) 300 MG capsule, Take 1 capsule (300 mg total) by mouth 2 (two) times daily., Disp: 180 capsule, Rfl: 1   hydroxypropyl methylcellulose / hypromellose (ISOPTO TEARS / GONIOVISC) 2.5 % ophthalmic solution, Place 1 drop into the right eye 4 (four) times daily as needed for dry eyes. (Patient not taking: Reported on 12/14/2023), Disp: 15 mL, Rfl: 12   levothyroxine  (SYNTHROID ) 100 MCG tablet, Take 1 tablet (100 mcg total) by mouth daily before breakfast., Disp: 90 tablet, Rfl: 1   lidocaine  (LIDODERM ) 5 %,  Place 1 patch onto the skin daily. Remove & Discard patch within 12 hours or as directed by MD (Patient not taking: Reported on 12/14/2023), Disp: 30 patch, Rfl: 0   methocarbamol  (ROBAXIN ) 500 MG tablet, Take 1 tablet (500 mg total) by mouth 2 (two) times daily. (Patient not taking: Reported on 12/14/2023), Disp: 20 tablet, Rfl: 0   neomycin-polymyxin-hydrocortisone (CORTISPORIN)  3.5-10000-1 OTIC suspension, Instill 3 drops three times daily into left ear for 5 days. (Patient not taking: Reported on 12/14/2023), Disp: , Rfl:    omeprazole  (PRILOSEC) 20 MG capsule, Take 1 capsule (20 mg total) by mouth daily., Disp: 90 capsule, Rfl: 0   oxybutynin (DITROPAN-XL) 10 MG 24 hr tablet, Take 1 tablet by mouth daily. (Patient not taking: Reported on 12/14/2023), Disp: , Rfl:    progesterone (PROMETRIUM) 100 MG capsule, Take 100 mg by mouth daily. (Patient not taking: Reported on 12/14/2023), Disp: , Rfl:    tiZANidine  (ZANAFLEX ) 2 MG tablet, Take 2 tablets (4 mg total) by mouth every 8 (eight) hours as needed for muscle spasms. (Patient not taking: Reported on 12/14/2023), Disp: 30 tablet, Rfl: 2   valsartan  (DIOVAN ) 40 MG tablet, Take 1 tablet (40 mg total) by mouth daily., Disp: 90 tablet, Rfl: 0   Allergies  Allergen Reactions   Aspirin Nausea Only   Caffeine Nausea Only   Sulfa  Antibiotics Nausea And Vomiting and Nausea Only    Past Medical History:  Diagnosis Date   Bell's palsy    G-6-PD deficiency    GERD (gastroesophageal reflux disease)    Hypertension    IBS (irritable bowel syndrome)    Ovarian cyst    Seasonal allergies    Thyroid  disease    HYPER     Past Surgical History:  Procedure Laterality Date   APPENDECTOMY     CESAREAN SECTION     CHOLECYSTECTOMY  05/31/2018   FOOT SURGERY     OVARIAN CYST REMOVAL     TONSILLECTOMY     TUBAL LIGATION      Family History  Problem Relation Age of Onset   Healthy Mother    Healthy Father     Social History   Tobacco Use   Smoking status: Former    Passive exposure: Past   Smokeless tobacco: Never  Vaping Use   Vaping status: Never Used  Substance Use Topics   Alcohol use: Yes    Comment: occ   Drug use: Yes    Types: Marijuana    ROS Refer to HPI for ROS details.  Objective:   Vitals: BP (!) 166/103 (BP Location: Left Arm)   Pulse 78   Temp 98.4 F (36.9 C) (Oral)   Resp 16   LMP   (LMP Unknown)   SpO2 98%   Physical Exam Vitals and nursing note reviewed.  Constitutional:      General: She is not in acute distress.    Appearance: Normal appearance. She is well-developed. She is not ill-appearing or toxic-appearing.  HENT:     Head: Normocephalic and atraumatic.     Comments: Mild to moderate facial droop noted secondary to longstanding history of Bell's palsy.  Minimal slurred speech noted on exam. Eyes:     Extraocular Movements: Extraocular movements intact.     Conjunctiva/sclera: Conjunctivae normal.  Cardiovascular:     Rate and Rhythm: Normal rate.  Pulmonary:     Effort: Pulmonary effort is normal. No respiratory distress.  Skin:    General: Skin is warm and  dry.  Neurological:     General: No focal deficit present.     Mental Status: She is alert and oriented to person, place, and time. Mental status is at baseline.     Cranial Nerves: No cranial nerve deficit.     Sensory: No sensory deficit.     Motor: No weakness.     Coordination: Coordination normal.     Gait: Gait normal.  Psychiatric:        Mood and Affect: Mood normal.        Behavior: Behavior normal.     Procedures  No results found for this or any previous visit (from the past 24 hours).  No results found.   Assessment and Plan :     Discharge Instructions      Medication Refill  1. Primary hypertension (Primary) - amLODipine  (NORVASC ) 10 MG tablet; Take 1 tablet (10 mg total) by mouth daily.  Dispense: 90 tablet; Refill: 1 - valsartan  (DIOVAN ) 40 MG tablet; Take 1 tablet (40 mg total) by mouth daily.  Dispense: 90 tablet; Refill: 0  2. Gastroesophageal reflux disease, unspecified whether esophagitis present - omeprazole  (PRILOSEC) 20 MG capsule; Take 1 capsule (20 mg total) by mouth daily.  Dispense: 90 capsule; Refill: 0  3. Hypothyroidism, unspecified type - levothyroxine  (SYNTHROID ) 100 MCG tablet; Take 1 tablet (100 mcg total) by mouth daily before breakfast.   Dispense: 90 tablet; Refill: 1  4. Bell's palsy - gabapentin  (NEURONTIN ) 300 MG capsule; Take 1 capsule (300 mg total) by mouth 2 (two) times daily.  Dispense: 180 capsule; Refill: 1       Ethel KATHEE Aurea Aurea, [place], [place] 12/14/23 1138
